# Patient Record
Sex: Male | Born: 1960 | Race: Black or African American | Hispanic: No | Marital: Married | Smoking: Never smoker
Health system: Southern US, Community
[De-identification: ages and names within clinical notes are randomized; demographics above are authoritative.]

## PROBLEM LIST (undated history)

## (undated) DIAGNOSIS — E119 Type 2 diabetes mellitus without complications: Secondary | ICD-10-CM

## (undated) HISTORY — PX: VASECTOMY: SHX75

---

## 2017-04-26 ENCOUNTER — Emergency Department (HOSPITAL_BASED_OUTPATIENT_CLINIC_OR_DEPARTMENT_OTHER): Payer: BLUE CROSS/BLUE SHIELD

## 2017-04-26 ENCOUNTER — Emergency Department (HOSPITAL_BASED_OUTPATIENT_CLINIC_OR_DEPARTMENT_OTHER)
Admission: EM | Admit: 2017-04-26 | Discharge: 2017-04-26 | Disposition: A | Discharge status: Home / Self Care | Payer: BLUE CROSS/BLUE SHIELD | Source: Home / Self Care | Attending: Emergency Medicine | Admitting: Emergency Medicine

## 2017-04-26 ENCOUNTER — Encounter (HOSPITAL_BASED_OUTPATIENT_CLINIC_OR_DEPARTMENT_OTHER): Payer: Self-pay | Admitting: *Deleted

## 2017-04-26 ENCOUNTER — Other Ambulatory Visit: Payer: Self-pay

## 2017-04-26 DIAGNOSIS — R1084 Generalized abdominal pain: Secondary | ICD-10-CM

## 2017-04-26 DIAGNOSIS — K5900 Constipation, unspecified: Secondary | ICD-10-CM | POA: Insufficient documentation

## 2017-04-26 DIAGNOSIS — R110 Nausea: Secondary | ICD-10-CM | POA: Insufficient documentation

## 2017-04-26 DIAGNOSIS — R339 Retention of urine, unspecified: Secondary | ICD-10-CM | POA: Insufficient documentation

## 2017-04-26 DIAGNOSIS — E119 Type 2 diabetes mellitus without complications: Secondary | ICD-10-CM | POA: Insufficient documentation

## 2017-04-26 DIAGNOSIS — C187 Malignant neoplasm of sigmoid colon: Secondary | ICD-10-CM | POA: Diagnosis not present

## 2017-04-26 DIAGNOSIS — K56699 Other intestinal obstruction unspecified as to partial versus complete obstruction: Secondary | ICD-10-CM | POA: Diagnosis not present

## 2017-04-26 HISTORY — DX: Type 2 diabetes mellitus without complications: E11.9

## 2017-04-26 LAB — COMPREHENSIVE METABOLIC PANEL
ALK PHOS: 102 U/L (ref 38–126)
ALT: 17 U/L (ref 17–63)
AST: 20 U/L (ref 15–41)
Albumin: 4.1 g/dL (ref 3.5–5.0)
Anion gap: 11 (ref 5–15)
BUN: 14 mg/dL (ref 6–20)
CALCIUM: 9.6 mg/dL (ref 8.9–10.3)
CO2: 23 mmol/L (ref 22–32)
CREATININE: 1.23 mg/dL (ref 0.61–1.24)
Chloride: 99 mmol/L — ABNORMAL LOW (ref 101–111)
GFR calc non Af Amer: >60 mL/min (ref >60)
Glucose, Bld: 125 mg/dL — ABNORMAL HIGH (ref 65–99)
Potassium: 4.2 mmol/L (ref 3.5–5.1)
Sodium: 133 mmol/L — ABNORMAL LOW (ref 135–145)
Total Bilirubin: 0.6 mg/dL (ref 0.3–1.2)
Total Protein: 8.6 g/dL — ABNORMAL HIGH (ref 6.5–8.1)

## 2017-04-26 LAB — CBC
HCT: 40.1 % (ref 39.0–52.0)
Hemoglobin: 13.6 g/dL (ref 13.0–17.0)
MCH: 31.2 pg (ref 26.0–34.0)
MCHC: 33.9 g/dL (ref 30.0–36.0)
MCV: 92 fL (ref 78.0–100.0)
PLATELETS: 424 10*3/uL — AB (ref 150–400)
RBC: 4.36 MIL/uL (ref 4.22–5.81)
RDW: 13.3 % (ref 11.5–15.5)
WBC: 12.4 10*3/uL — ABNORMAL HIGH (ref 4.0–10.5)

## 2017-04-26 LAB — CBG MONITORING, ED: Glucose-Capillary: 93 mg/dL (ref 65–99)

## 2017-04-26 LAB — URINALYSIS, ROUTINE W REFLEX MICROSCOPIC
BILIRUBIN URINE: NEGATIVE
Glucose, UA: NEGATIVE mg/dL
KETONES UR: 15 mg/dL — AB
Leukocytes, UA: NEGATIVE
Nitrite: NEGATIVE
PROTEIN: NEGATIVE mg/dL
Specific Gravity, Urine: 1.01 (ref 1.005–1.030)
pH: 6 (ref 5.0–8.0)

## 2017-04-26 LAB — LIPASE, BLOOD: Lipase: 23 U/L (ref 11–51)

## 2017-04-26 LAB — URINALYSIS, MICROSCOPIC (REFLEX)

## 2017-04-26 MED ORDER — MORPHINE SULFATE (PF) 4 MG/ML IV SOLN
4.0000 mg | Freq: Once | INTRAVENOUS | Status: AC
  Administered 2017-04-26: 4 mg via INTRAVENOUS
  Filled 2017-04-26: qty 1

## 2017-04-26 MED ORDER — METRONIDAZOLE 500 MG PO TABS
500.0000 mg | ORAL_TABLET | Freq: Once | ORAL | Status: AC
  Administered 2017-04-26: 500 mg via ORAL
  Filled 2017-04-26: qty 1

## 2017-04-26 MED ORDER — DICYCLOMINE HCL 10 MG/ML IM SOLN
20.0000 mg | Freq: Once | INTRAMUSCULAR | Status: AC
  Administered 2017-04-26: 20 mg via INTRAMUSCULAR
  Filled 2017-04-26: qty 2

## 2017-04-26 MED ORDER — CIPROFLOXACIN HCL 500 MG PO TABS
500.0000 mg | ORAL_TABLET | Freq: Once | ORAL | Status: AC
  Administered 2017-04-26: 500 mg via ORAL
  Filled 2017-04-26: qty 1

## 2017-04-26 MED ORDER — FAMOTIDINE IN NACL 20-0.9 MG/50ML-% IV SOLN
20.0000 mg | Freq: Once | INTRAVENOUS | Status: AC
  Administered 2017-04-26: 20 mg via INTRAVENOUS
  Filled 2017-04-26: qty 50

## 2017-04-26 MED ORDER — SODIUM CHLORIDE 0.9 % IV BOLUS (SEPSIS)
1000.0000 mL | Freq: Once | INTRAVENOUS | Status: AC
  Administered 2017-04-26: 1000 mL via INTRAVENOUS

## 2017-04-26 MED ORDER — ONDANSETRON 4 MG PO TBDP: 4.0000 mg | ORAL_TABLET | Freq: Three times a day (TID) | ORAL | 0 refills | Status: DC | PRN

## 2017-04-26 MED ORDER — IOPAMIDOL (ISOVUE-300) INJECTION 61%
100.0000 mL | Freq: Once | INTRAVENOUS | Status: AC | PRN
  Administered 2017-04-26: 100 mL via INTRAVENOUS

## 2017-04-26 MED ORDER — ONDANSETRON HCL 4 MG/2ML IJ SOLN
4.0000 mg | Freq: Once | INTRAMUSCULAR | Status: AC
  Administered 2017-04-26: 4 mg via INTRAVENOUS
  Filled 2017-04-26: qty 2

## 2017-04-26 MED ORDER — LIDOCAINE HCL 2 % EX GEL
1.0000 | Freq: Once | CUTANEOUS | Status: AC
Start: 2017-04-26 — End: 2017-04-26
  Administered 2017-04-26: 1 via URETHRAL
  Filled 2017-04-26: qty 20

## 2017-04-26 MED ORDER — METRONIDAZOLE 500 MG PO TABS: 500.0000 mg | ORAL_TABLET | Freq: Three times a day (TID) | ORAL | 0 refills | Status: DC

## 2017-04-26 MED ORDER — DICYCLOMINE HCL 20 MG PO TABS: 20.0000 mg | ORAL_TABLET | Freq: Two times a day (BID) | ORAL | 0 refills | Status: DC

## 2017-04-26 MED ORDER — GI COCKTAIL ~~LOC~~
30.0000 mL | Freq: Once | ORAL | Status: AC
  Administered 2017-04-26: 30 mL via ORAL
  Filled 2017-04-26: qty 30

## 2017-04-26 MED ORDER — CIPROFLOXACIN HCL 500 MG PO TABS: 500.0000 mg | ORAL_TABLET | Freq: Two times a day (BID) | ORAL | 0 refills | Status: DC

## 2017-04-26 MED ORDER — LIDOCAINE HCL (CARDIAC) 20 MG/ML IV SOLN
INTRAVENOUS | Status: AC
  Filled 2017-04-26: qty 5

## 2017-04-26 NOTE — ED Notes (Signed)
Pt given water for PO challenge 

## 2017-04-26 NOTE — ED Notes (Addendum)
Pt not able to get urine sample right now

## 2017-04-26 NOTE — ED Provider Notes (Signed)
[facility] EMERGENCY DEPARTMENT Provider Note   CSN: [id number] Arrival date & time: 04/26/17  1317     History   Chief Complaint Chief Complaint  Patient presents with  . Abdominal Pain    HPI Harry Avila is a 57 y.o. male.  HPI 57 year old African-American male with no pertinent past medical history presents to the emergency department today with complaints of generalized abdominal pain.  The patient states that his symptoms started approximately 2-3 days ago.  Patient states he was seen in urgent care for same and diagnosed with a viral illness.  Patient states that his symptoms are not improving.  He reports sharp cramping mid abdominal pain that is constant.  Nothing makes better or worse.  It is not associated with food.  The patient also reports loose stools over the past 1-2 months with no bowel movement in the past 2-3 days.  He states that he has not had any passing of gas over the past day and a half.  Patient also reports nausea but denies any emesis.  States that he recently returned from [place] 1 week ago.  Denies any known sick contacts.  Nothing makes his symptoms better or worse.  He is not taking anything for symptoms prior to arrival.  Pt denies any fever, chill, ha, vision changes, lightheadedness, dizziness, congestion, neck pain, cp, sob, cough, urinary symptoms, melena, hematochezia, lower extremity paresthesias.   Past Medical History:  Diagnosis Date  . Diabetes mellitus without complication ([place])     There are no active problems to display for this patient.   History reviewed. No pertinent surgical history.     Home Medications    Prior to Admission medications   Not on File    Family History No family history on file.  Social History Social History   Tobacco Use  . Smoking status: Never Smoker  . Smokeless tobacco: Never Used  Substance Use Topics  . Alcohol use: No    Frequency: Never  . Drug use: No      Allergies   Patient has no known allergies.   Review of Systems Review of Systems  Constitutional: Negative for chills and fever.  HENT: Negative for congestion and sore throat.   Eyes: Negative for visual disturbance.  Respiratory: Negative for cough and shortness of breath.   Cardiovascular: Negative for chest pain.  Gastrointestinal: Positive for abdominal pain, constipation and nausea. Negative for diarrhea and vomiting.  Genitourinary: Negative for dysuria, flank pain, frequency, hematuria, scrotal swelling, testicular pain and urgency.  Musculoskeletal: Negative for arthralgias and myalgias.  Skin: Negative for rash.  Neurological: Negative for dizziness, syncope, weakness, light-headedness, numbness and headaches.  Psychiatric/Behavioral: Negative for sleep disturbance. The patient is not nervous/anxious.      Physical Exam Updated Vital Signs BP (!) 144/104 (BP Location: Right Arm)   Pulse 92   Temp 98.4 F (36.9 C) (Oral)   Resp 16   Ht 5\' 10"  (1.778 m)   Wt 79.4 kg (175 lb)   SpO2 100%   BMI 25.11 kg/m   Physical Exam  Constitutional: He is oriented to person, place, and time. He appears well-developed and well-nourished.  Non-toxic appearance. He appears distressed (uncomfortable due to the pain).  HENT:  Head: Normocephalic and atraumatic.  Mouth/Throat: Oropharynx is clear and moist.  Eyes: Conjunctivae are normal. Pupils are equal, round, and reactive to light. Right eye exhibits no discharge. Left eye exhibits no discharge.  Neck: Normal range of motion.  Neck supple.  Cardiovascular: Normal rate, regular rhythm, normal heart sounds and intact distal pulses. Exam reveals no gallop and no friction rub.  No murmur heard. Pulmonary/Chest: Effort normal and breath sounds normal. No stridor. No respiratory distress. He has no wheezes. He has no rhonchi. He has no rales. He exhibits no tenderness.  Abdominal: Soft. He exhibits no distension and no pulsatile  midline mass. Bowel sounds are increased. There is generalized tenderness and tenderness in the epigastric area and periumbilical area. There is guarding. There is no rigidity, no rebound, no CVA tenderness, no tenderness at McBurney's point and negative Murphy's sign.  Musculoskeletal: Normal range of motion. He exhibits no tenderness.  Lymphadenopathy:    He has no cervical adenopathy.  Neurological: He is alert and oriented to person, place, and time.  Skin: Skin is warm and dry. Capillary refill takes less than 2 seconds. No rash noted.  Psychiatric: His behavior is normal. Judgment and thought content normal.  Nursing note and vitals reviewed.    ED Treatments / Results  Labs (all labs ordered are listed, but only abnormal results are displayed) Labs Reviewed  COMPREHENSIVE METABOLIC PANEL - Abnormal; Notable for the following components:      Result Value   Sodium 133 (*)    Chloride 99 (*)    Glucose, Bld 125 (*)    Total Protein 8.6 (*)    All other components within normal limits  CBC - Abnormal; Notable for the following components:   WBC 12.4 (*)    Platelets 424 (*)    All other components within normal limits  LIPASE, BLOOD  URINALYSIS, ROUTINE W REFLEX MICROSCOPIC    EKG  EKG Interpretation None       Radiology Ct Abdomen Pelvis W Contrast  Addendum Date: 04/26/2017   ADDENDUM REPORT: 04/26/2017 17:15 ADDENDUM: After review of the images, there is possible focal narrowing at the sigmoid colon, series 2, image number 45 with colon upstream to this filled with gas and liquid stools. Findings could be secondary to spasm within the colon or possible small focal area of colitis. Follow-up examination with oral contrast could be helpful if there is high suspicion for obstructive process. Electronically Signed   By: Donavan Foil M.D.   On: 04/26/2017 17:15   Result Date: 04/26/2017 CLINICAL DATA:  Pain across the entire abdomen at the umbilicus with nausea and  vomiting EXAM: CT ABDOMEN AND PELVIS WITH CONTRAST TECHNIQUE: Multidetector CT imaging of the abdomen and pelvis was performed using the standard protocol following bolus administration of intravenous contrast. CONTRAST:  121mL ISOVUE-300 IOPAMIDOL (ISOVUE-300) INJECTION 61% COMPARISON:  None. FINDINGS: Lower chest: Lung bases demonstrate no acute consolidation or pleural effusion. Normal heart size. Hepatobiliary: Subcentimeter hypodensity anterior aspect of the liver too small to further characterize. 11 mm intermediate density lesion posterior right hepatic lobe. No calcified gallstones or biliary dilatation Pancreas: Unremarkable. No pancreatic ductal dilatation or surrounding inflammatory changes. Spleen: Normal in size without focal abnormality. Adrenals/Urinary Tract: Adrenal glands are within normal limits. No hydronephrosis. 17 mm intermediate density lesion mid pole right kidney. Distended urinary bladder Stomach/Bowel: Stomach nonenlarged. Diffuse fluid-filled small and large bowel. Negative appendix. No colon wall thickening. Vascular/Lymphatic: No significant vascular findings are present. No enlarged abdominal or pelvic lymph nodes. Reproductive: Prostate is unremarkable. Other: Negative for free air. Tiny amount of fluid in the right lower quadrant. Musculoskeletal: Coarse dystrophic appearing calcification anterior to the left pubic bone, could relate to prior trauma or hematoma. No acute  or suspicious lesion IMPRESSION: 1. Negative for bowel obstruction, appendicitis, or bowel wall thickening. Scattered fluid-filled loops of small bowel with mild gaseous and fluid enlargement of the colon, query enteritis 2. Tiny amount of free fluid in the right lower quadrant 3. Indeterminate hypodense lesions in the right hepatic lobe and right kidney. When the patient is clinically stable and able to follow directions and hold their breath (preferably as an outpatient) further evaluation with dedicated abdominal  MRI should be considered. Electronically Signed: By: Donavan Foil M.D. On: 04/26/2017 15:50    Procedures Procedures (including critical care time)  Medications Ordered in ED Medications  lidocaine (cardiac) 100 mg/48ml (XYLOCAINE) 20 MG/ML injection 2% (  Not Given 04/26/17 1731)  morphine 4 MG/ML injection 4 mg (4 mg Intravenous Given 04/26/17 1455)  sodium chloride 0.9 % bolus 1,000 mL (0 mLs Intravenous Stopped 04/26/17 1555)  ondansetron (ZOFRAN) injection 4 mg (4 mg Intravenous Given 04/26/17 1455)  iopamidol (ISOVUE-300) 61 % injection 100 mL (100 mLs Intravenous Contrast Given 04/26/17 1525)  famotidine (PEPCID) IVPB 20 mg premix (0 mg Intravenous Stopped 04/26/17 1702)  gi cocktail (Maalox,Lidocaine,Donnatal) (30 mLs Oral Given 04/26/17 1614)  morphine 4 MG/ML injection 4 mg (4 mg Intravenous Given 04/26/17 1711)  dicyclomine (BENTYL) injection 20 mg (20 mg Intramuscular Given 04/26/17 1711)  lidocaine (XYLOCAINE) 2 % jelly 1 application (1 application Urethral Given 04/26/17 1730)     Initial Impression / Assessment and Plan / ED Course  I have reviewed the triage vital signs and the nursing notes.  Pertinent labs & imaging results that were available during my care of the patient were reviewed by me and considered in my medical decision making (see chart for details).     Patient presents to the emergency department today for evaluation of abdominal pain, decreased bowel movements, nausea.  Patient denies any associated fevers.  He did return recently from a stay over in [place].  Patient states that his bowels have been very loose for the past month with intermittent mucus and blood.  Denies any recent bloody stools.  States that he has not had a bowel movement in 4-5 days.  Patient also states that he has not passed gas for the past day.  Reports nausea but denies any emesis.  Denies any associated urinary symptoms.  Patient does appear uncomfortable due to pain.  Patient's  vital signs are reassuring.  Patient is afebrile.  No tachycardia noted.  Patient is hypertensive without history of same.  Lab work reveals a mild leukocytosis of 12,000.  The patient's electrolytes appear at baseline.  Patient's kidney function is normal.  Liver enzymes are unremarkable.  Lipase is normal.  UA does not show any signs of infection.  CT imaging was obtained;  MPRESSION: 1. Negative for bowel obstruction, appendicitis, or bowel wall thickening. Scattered fluid-filled loops of small bowel with mild gaseous and fluid enlargement of the colon, query enteritis 2. Tiny amount of free fluid in the right lower quadrant 3. Indeterminate hypodense lesions in the right hepatic lobe and right kidney. When the patient is clinically stable and able to follow directions and hold their breath (preferably as an outpatient) further evaluation with dedicated abdominal MRI should be considered.  After speaking with radiologist that she did have an addendum to the report but no signs of acute or high-grade obstruction.  ADDENDUM REPORT: 04/26/2017 17:15 ADDENDUM: After review of the images, there is possible focal narrowing at the sigmoid colon, series 2, image number  45 with colon upstream to this filled with gas and liquid stools. Findings could be secondary to spasm within the colon or possible small focal area of colitis. Follow-up examination with oral contrast could be helpful if there is high suspicion for obstructive process. Electronically Signed   By: Donavan Foil M.D.   On: 04/26/2017 17:15   Patient bladder is noted to be significantly distended.  Patient able to give [place] a urine sample in the ED.  Bladder scan reveals over thousand mL's of urine in the bladder.  Patient has acute urinary retention.  No history of same.  Patient has no lower extremity weakness on exam.  Sensation is intact.  No recent injury though be concerning for cauda equina.  Patient will have Foley catheter placed with  leg bag.  I will treat patient with Cipro and Flagyl for enteritis versus colitis.  Also give symptomatic treatment at home.  Encourage clear liquid diet for 24 hours and advance diet as tolerated.  Patient able to tolerate p.o. fluids in the ED.  Patient does not have any reason for admission at this time.  Will need urology follow-up.  After Foley was placed patient states that he feels less pressure in his abdomen.  Patient had 1000 cc of urine that was returned.  Repeat abdominal exam is benign at this time.  Patient appears more comfortable in the room.  Again we will treat patient with Cipro and Flagyl.  Have given urology follow-up.  Instructed patient to return in 2 days to the ED if symptoms not improving.  Have given him very strict return precautions for signs of obstruction or worsening symptoms.  Patient tolerating p.o. fluids.  Pt is hemodynamically stable, in NAD, & able to ambulate in the ED. Evaluation does not show pathology that would require ongoing emergent intervention or inpatient treatment. I explained the diagnosis to the patient. Pain has been managed & has no complaints prior to dc. Pt is comfortable with above plan and is stable for discharge at this time. All questions were answered prior to disposition. Strict return precautions for f/u to the ED were discussed. Encouraged follow up with PCP.  Pt dicussed with Dr. Leonette Monarch who is agreeable with the above plan.    Final Clinical Impressions(s) / ED Diagnoses   Final diagnoses:  Urinary retention  Generalized abdominal pain    ED Discharge Orders    None       Aaron Edelman 04/26/17 1813    Duffy Bruce, MD 04/27/17 [phone]

## 2017-04-26 NOTE — ED Triage Notes (Signed)
Abdominal pain across his mid abdomen and nausea for 3 days. He was seen at UC earlier in the week for same and told he has a virus.

## 2017-04-26 NOTE — Discharge Instructions (Signed)
I am treating you for infection of your intestines with antibiotics.  Take these medications as prescribed and complete the entire course.  Have given you Bentyl for any muscle or intestinal spasms.  For nausea take Zofran.  You do need to follow-up with urology.  Please call tomorrow to schedule an appointment this week given your urinary retention and your Foley in place.  Also take Motrin and Tylenol for pain.  If you have not had a bowel movement in 2 days return to the ED for further evaluation.  If you get worse in 2 days including fevers, bloody stools, worsening pain or vomiting return to the ED immediately.

## 2017-04-27 ENCOUNTER — Other Ambulatory Visit: Payer: Self-pay

## 2017-04-27 ENCOUNTER — Emergency Department (HOSPITAL_BASED_OUTPATIENT_CLINIC_OR_DEPARTMENT_OTHER): Payer: BLUE CROSS/BLUE SHIELD

## 2017-04-27 ENCOUNTER — Encounter (HOSPITAL_BASED_OUTPATIENT_CLINIC_OR_DEPARTMENT_OTHER): Payer: Self-pay | Admitting: Emergency Medicine

## 2017-04-27 ENCOUNTER — Inpatient Hospital Stay (HOSPITAL_BASED_OUTPATIENT_CLINIC_OR_DEPARTMENT_OTHER)
Admission: EM | Admit: 2017-04-27 | Discharge: 2017-05-05 | DRG: 330 | Disposition: A | Discharge status: Home / Self Care | Payer: BLUE CROSS/BLUE SHIELD | Attending: Internal Medicine | Admitting: Internal Medicine

## 2017-04-27 DIAGNOSIS — N2889 Other specified disorders of kidney and ureter: Secondary | ICD-10-CM | POA: Diagnosis present

## 2017-04-27 DIAGNOSIS — K56600 Partial intestinal obstruction, unspecified as to cause: Secondary | ICD-10-CM

## 2017-04-27 DIAGNOSIS — R160 Hepatomegaly, not elsewhere classified: Secondary | ICD-10-CM | POA: Diagnosis present

## 2017-04-27 DIAGNOSIS — Z933 Colostomy status: Secondary | ICD-10-CM

## 2017-04-27 DIAGNOSIS — R933 Abnormal findings on diagnostic imaging of other parts of digestive tract: Secondary | ICD-10-CM

## 2017-04-27 DIAGNOSIS — K56609 Unspecified intestinal obstruction, unspecified as to partial versus complete obstruction: Secondary | ICD-10-CM | POA: Diagnosis not present

## 2017-04-27 DIAGNOSIS — Z79899 Other long term (current) drug therapy: Secondary | ICD-10-CM | POA: Diagnosis not present

## 2017-04-27 DIAGNOSIS — K639 Disease of intestine, unspecified: Secondary | ICD-10-CM | POA: Diagnosis not present

## 2017-04-27 DIAGNOSIS — K56699 Other intestinal obstruction unspecified as to partial versus complete obstruction: Secondary | ICD-10-CM | POA: Diagnosis present

## 2017-04-27 DIAGNOSIS — F419 Anxiety disorder, unspecified: Secondary | ICD-10-CM | POA: Diagnosis present

## 2017-04-27 DIAGNOSIS — N179 Acute kidney failure, unspecified: Secondary | ICD-10-CM | POA: Diagnosis present

## 2017-04-27 DIAGNOSIS — N289 Disorder of kidney and ureter, unspecified: Secondary | ICD-10-CM

## 2017-04-27 DIAGNOSIS — C187 Malignant neoplasm of sigmoid colon: Secondary | ICD-10-CM | POA: Diagnosis present

## 2017-04-27 DIAGNOSIS — K6389 Other specified diseases of intestine: Secondary | ICD-10-CM

## 2017-04-27 DIAGNOSIS — E871 Hypo-osmolality and hyponatremia: Secondary | ICD-10-CM

## 2017-04-27 DIAGNOSIS — E119 Type 2 diabetes mellitus without complications: Secondary | ICD-10-CM | POA: Diagnosis present

## 2017-04-27 DIAGNOSIS — I10 Essential (primary) hypertension: Secondary | ICD-10-CM | POA: Diagnosis present

## 2017-04-27 LAB — CBC WITH DIFFERENTIAL/PLATELET
BASOS ABS: 0 10*3/uL (ref 0.0–0.1)
Basophils Relative: 0 %
Eosinophils Absolute: 0 10*3/uL (ref 0.0–0.7)
Eosinophils Relative: 0 %
HEMATOCRIT: 40.6 % (ref 39.0–52.0)
Hemoglobin: 13.5 g/dL (ref 13.0–17.0)
Lymphocytes Relative: 13 %
Lymphs Abs: 1.2 10*3/uL (ref 0.7–4.0)
MCH: 30.6 pg (ref 26.0–34.0)
MCHC: 33.3 g/dL (ref 30.0–36.0)
MCV: 92.1 fL (ref 78.0–100.0)
MONO ABS: 0.9 10*3/uL (ref 0.1–1.0)
Monocytes Relative: 11 %
NEUTROS ABS: 6.7 10*3/uL (ref 1.7–7.7)
Neutrophils Relative %: 76 %
PLATELETS: 427 10*3/uL — AB (ref 150–400)
RBC: 4.41 MIL/uL (ref 4.22–5.81)
RDW: 13.5 % (ref 11.5–15.5)
WBC: 8.9 10*3/uL (ref 4.0–10.5)

## 2017-04-27 LAB — COMPREHENSIVE METABOLIC PANEL
ALBUMIN: 3.7 g/dL (ref 3.5–5.0)
ALK PHOS: 93 U/L (ref 38–126)
ALT: 17 U/L (ref 17–63)
ANION GAP: 5 (ref 5–15)
AST: 19 U/L (ref 15–41)
BILIRUBIN TOTAL: 0.7 mg/dL (ref 0.3–1.2)
BUN: 20 mg/dL (ref 6–20)
CALCIUM: 9.3 mg/dL (ref 8.9–10.3)
CO2: 27 mmol/L (ref 22–32)
Chloride: 100 mmol/L — ABNORMAL LOW (ref 101–111)
Creatinine, Ser: 1.49 mg/dL — ABNORMAL HIGH (ref 0.61–1.24)
GFR, EST AFRICAN AMERICAN: 60 mL/min — AB (ref >60)
GFR, EST NON AFRICAN AMERICAN: 52 mL/min — AB (ref >60)
GLUCOSE: 129 mg/dL — AB (ref 65–99)
Potassium: 5 mmol/L (ref 3.5–5.1)
Sodium: 132 mmol/L — ABNORMAL LOW (ref 135–145)
TOTAL PROTEIN: 8 g/dL (ref 6.5–8.1)

## 2017-04-27 LAB — I-STAT CG4 LACTIC ACID, ED
LACTIC ACID, VENOUS: 1.71 mmol/L (ref 0.5–1.9)
Lactic Acid, Venous: 1.56 mmol/L (ref 0.5–1.9)

## 2017-04-27 LAB — GLUCOSE, CAPILLARY
GLUCOSE-CAPILLARY: 97 mg/dL (ref 65–99)
Glucose-Capillary: 76 mg/dL (ref 65–99)

## 2017-04-27 LAB — CBG MONITORING, ED: GLUCOSE-CAPILLARY: 79 mg/dL (ref 65–99)

## 2017-04-27 MED ORDER — MORPHINE SULFATE (PF) 4 MG/ML IV SOLN
4.0000 mg | INTRAVENOUS | Status: AC | PRN
  Administered 2017-04-27 – 2017-04-28 (×3): 4 mg via INTRAVENOUS
  Filled 2017-04-27 (×3): qty 1

## 2017-04-27 MED ORDER — SODIUM CHLORIDE 0.9 % IV SOLN
INTRAVENOUS | Status: AC
  Administered 2017-04-27 – 2017-04-28 (×2): via INTRAVENOUS

## 2017-04-27 MED ORDER — MORPHINE SULFATE (PF) 4 MG/ML IV SOLN
4.0000 mg | Freq: Once | INTRAVENOUS | Status: AC
  Administered 2017-04-27: 4 mg via INTRAVENOUS
  Filled 2017-04-27: qty 1

## 2017-04-27 MED ORDER — ONDANSETRON HCL 4 MG/2ML IJ SOLN
4.0000 mg | Freq: Once | INTRAMUSCULAR | Status: AC
Start: 2017-04-27 — End: 2017-04-27
  Administered 2017-04-27: 4 mg via INTRAVENOUS
  Filled 2017-04-27: qty 2

## 2017-04-27 MED ORDER — ONDANSETRON HCL 4 MG/2ML IJ SOLN
4.0000 mg | Freq: Once | INTRAMUSCULAR | Status: AC
  Administered 2017-04-27: 4 mg via INTRAVENOUS
  Filled 2017-04-27: qty 2

## 2017-04-27 MED ORDER — SODIUM CHLORIDE 0.9 % IV BOLUS (SEPSIS)
1000.0000 mL | Freq: Once | INTRAVENOUS | Status: AC
  Administered 2017-04-27: 1000 mL via INTRAVENOUS

## 2017-04-27 MED ORDER — INSULIN ASPART 100 UNIT/ML ~~LOC~~ SOLN: 0.0000 [IU] | SUBCUTANEOUS | Status: DC

## 2017-04-27 MED ORDER — ONDANSETRON HCL 4 MG/2ML IJ SOLN
4.0000 mg | Freq: Four times a day (QID) | INTRAMUSCULAR | Status: DC | PRN
  Administered 2017-04-28: 4 mg via INTRAVENOUS
  Filled 2017-04-27: qty 2

## 2017-04-27 MED ORDER — ENOXAPARIN SODIUM 40 MG/0.4ML ~~LOC~~ SOLN
40.0000 mg | SUBCUTANEOUS | Status: DC
  Administered 2017-04-27: 40 mg via SUBCUTANEOUS
  Filled 2017-04-27: qty 0.4

## 2017-04-27 MED ORDER — ACETAMINOPHEN 650 MG RE SUPP: 650.0000 mg | Freq: Four times a day (QID) | RECTAL | Status: DC | PRN

## 2017-04-27 MED ORDER — KCL IN DEXTROSE-NACL 10-5-0.45 MEQ/L-%-% IV SOLN
INTRAVENOUS | Status: DC
  Filled 2017-04-27: qty 1000

## 2017-04-27 MED ORDER — ACETAMINOPHEN 325 MG PO TABS: 650.0000 mg | ORAL_TABLET | Freq: Four times a day (QID) | ORAL | Status: DC | PRN

## 2017-04-27 NOTE — ED Notes (Signed)
CT staff giving pt the oral contrast at this time.

## 2017-04-27 NOTE — H&P (View-Only) (Signed)
Referring Provider: Dr. Rodena Piety, [facility] Primary Care Physician:  System, Pcp Not In Primary Gastroenterologist:  None, unassigned  Reason for Consultation:  Abnormal CT scan, colonic obstruction  HPI: Harry Avila is a 57 y.o. male who presented to [facility] on 04/26/17, with 2-3 days of abdominal pain.  He was seen initially in urgent care and thought to have a viral illness.  He reports loose stools over 1-40-month.  With no bowel movements over the 2-3 days prior to admission.  Is also not passing gas.  He has had nausea but no emesis.  He recently returned from [place], [place] about 1 week prior to admission.  Workup yesterday showed a fairly normal CMP.  WBC was 12.4, hemoglobin 13.6 grams, platelets were 424,000.  Urinalysis was unremarkable.  A CT scan was obtained.  There was no bowel obstruction appendicitis or bowel wall thickening noted.  There are scattered fluid-filled loops of small bowel with mild gaseous and fluid enlargement of the colon.  A possible enteritis.  There is also a tiny amount of fluid in the right lower quadrant.  CT scan showed that his bladder was very distended and a bladder scan revealed over 1000 mL's of urine in his bladder.  He was thought to have acute urinary retention.  Foley was placed and patient felt better after 1000 cc of urine was returned.  He was started on Cipro and Flagyl and arrangements were made for follow-up with urology.  A further review of the original CT showed possible focal narrowing of the sigmoid colon possibly a colitis.  They recommended a CT scan with contrast and this was completed today.  Today's study showed a persistent narrowing of the proximal sigmoid colon suspicious for malignancy causing a high-grade partial obstruction in the proximal colon and small bowel with increased amounts of free fluid in the pelvis.  At this point the Foley catheter had decompressed the bladder well.  Additionally there is a indeterminate liver  and right renal lesion not well delineated on the current exam.  Patient is being transferred to medicine service at [facility] and GI has been consulted.  Patient tells me that he's had a change in bowel habits over the past 5-6 weeks with loose stools.  Feeling badly over the past 3-4 days.  No BM or flatus in 4 days.  Has NGT in place.  Has not had any nausea or vomiting.  No abdominal pain currently.   Past Medical History:  Diagnosis Date  . Diabetes mellitus without complication ([place])     History reviewed. No pertinent surgical history.  Prior to Admission medications   Medication Sig Start Date End Date Taking? Authorizing Provider  ciprofloxacin (CIPRO) 500 MG tablet Take 1 tablet (500 mg total) by mouth 2 (two) times daily. 04/26/17   Doristine Devoid, PA-C  dicyclomine (BENTYL) 20 MG tablet Take 1 tablet (20 mg total) by mouth 2 (two) times daily. 04/26/17   Doristine Devoid, PA-C  metroNIDAZOLE (FLAGYL) 500 MG tablet Take 1 tablet (500 mg total) by mouth 3 (three) times daily. DO NOT CONSUME ALCOHOL WHILE TAKING THIS MEDICATION. 04/26/17   Ocie Cornfield T, PA-C  ondansetron (ZOFRAN-ODT) 4 MG disintegrating tablet Take 1 tablet (4 mg total) by mouth every 8 (eight) hours as needed for nausea. 04/26/17   Doristine Devoid, PA-C    Current Facility-Administered Medications  Medication Dose Route Frequency Provider Last Rate Last Dose  . dextrose 5 % and  0.45 % NaCl with KCl 10 mEq/L infusion   Intravenous Continuous Short, Mackenzie, MD      . insulin aspart (novoLOG) injection 0-5 Units  0-5 Units Subcutaneous Q4H Short, Mackenzie, MD      . morphine 4 MG/ML injection 4 mg  4 mg Intravenous Q1H PRN Tanna Furry, MD   4 mg at 04/27/17 1624   Current Outpatient Medications  Medication Sig Dispense Refill  . ciprofloxacin (CIPRO) 500 MG tablet Take 1 tablet (500 mg total) by mouth 2 (two) times daily. 14 tablet 0  . dicyclomine (BENTYL) 20 MG tablet Take 1 tablet  (20 mg total) by mouth 2 (two) times daily. 20 tablet 0  . metroNIDAZOLE (FLAGYL) 500 MG tablet Take 1 tablet (500 mg total) by mouth 3 (three) times daily. DO NOT CONSUME ALCOHOL WHILE TAKING THIS MEDICATION. 21 tablet 0  . ondansetron (ZOFRAN-ODT) 4 MG disintegrating tablet Take 1 tablet (4 mg total) by mouth every 8 (eight) hours as needed for nausea. 6 tablet 0    Allergies as of 04/27/2017  . (No Known Allergies)    No family history on file.  Social History   Socioeconomic History  . Marital status: Married    Spouse name: Not on file  . Number of children: Not on file  . Years of education: Not on file  . Highest education level: Not on file  Social Needs  . Financial resource strain: Not on file  . Food insecurity - worry: Not on file  . Food insecurity - inability: Not on file  . Transportation needs - medical: Not on file  . Transportation needs - non-medical: Not on file  Occupational History  . Not on file  Tobacco Use  . Smoking status: Never Smoker  . Smokeless tobacco: Never Used  Substance and Sexual Activity  . Alcohol use: No    Frequency: Never  . Drug use: No  . Sexual activity: Not on file  Other Topics Concern  . Not on file  Social History Narrative  . Not on file    Review of Systems: ROS is O/W negative except as mentioned in HPI.  Physical Exam: Vital signs in last 24 hours: Temp:  [98.2 F (36.8 C)-98.3 F (36.8 C)] 98.2 F (36.8 C) (01/15 1434) Pulse Rate:  [60-74] 74 (01/15 1323) Resp:  [16-18] 16 (01/15 1323) BP: (128-161)/(98-100) 161/100 (01/15 1323) SpO2:  [98 %-100 %] 98 % (01/15 1323) Weight:  [175 lb (79.4 kg)] 175 lb (79.4 kg) (01/15 1055)   General:  Alert, Well-developed, well-nourished, pleasant and cooperative in NAD Head:  Normocephalic and atraumatic. Eyes:  Sclera clear, no icterus.  Conjunctiva pink. Ears:  Normal auditory acuity. Mouth:  No deformity or lesions.   Lungs:  Clear throughout to auscultation.  No  wheezes, crackles, or rhonchi.  Heart:  Regular rate and rhythm; no murmurs, clicks, rubs,  or gallops. Abdomen:  Soft, slightly distended.  BS present.  Non-tender. Rectal:  Deferred  Msk:  Symmetrical without gross deformities. Pulses:  Normal pulses noted. Extremities:  Without clubbing or edema. Neurologic:  Alert and  oriented x4;  grossly normal neurologically. Skin:  Intact without significant lesions or rashes. Psych:  Alert and cooperative. Normal mood and affect.  Intake/Output this shift: Total I/O In: 2000 [IV Piggyback:2000] Out: 300 [Urine:300]  Lab Results: Recent Labs    04/26/17 1337 04/27/17 1130  WBC 12.4* 8.9  HGB 13.6 13.5  HCT 40.1 40.6  PLT 424* 427*  BMET Recent Labs    04/26/17 1337 04/27/17 1130  NA 133* 132*  K 4.2 5.0  CL 99* 100*  CO2 23 27  GLUCOSE 125* 129*  BUN 14 20  CREATININE 1.23 1.49*  CALCIUM 9.6 9.3   LFT Recent Labs    04/27/17 1130  PROT 8.0  ALBUMIN 3.7  AST 19  ALT 17  ALKPHOS 93  BILITOT 0.7   Studies/Results: Ct Abdomen Pelvis Wo Contrast  Result Date: 04/27/2017 CLINICAL DATA:  57 year old male with suprapubic pain for 2 days. Subsequent encounter. EXAM: CT ABDOMEN AND PELVIS WITHOUT CONTRAST TECHNIQUE: Multidetector CT imaging of the abdomen and pelvis was performed following the standard protocol without IV contrast. COMPARISON:  04/26/2017 CT. FINDINGS: Lower chest: Basilar subsegmental atelectasis. Heart size within normal limits. Hepatobiliary: 11 mm posterior right lobe of indeterminate hypodense lesion. Vicarious excretion of contrast in the gallbladder. Pancreas: Taking into account limitation by non contrast imaging, no pancreatic mass or inflammation. Spleen: Taking into account limitation by non contrast imaging, no splenic mass or enlargement. Adrenals/Urinary Tract: Dense pyramids bilaterally suggestive of medullary sponge disease without obstructing stone or hydronephrosis. 1.7 cm indeterminate lesion  anterior aspect midportion right kidney better delineated on prior CT. Urinary bladder decompressed with Foley catheter. No adrenal mass. Stomach/Bowel: Persistent circumferential narrowing of the proximal sigmoid colon. This does not cause complete obstruction as gas has traversed beyond this region however, proximal large and small bowel dilated consistent with high-grade partial obstruction. Vascular/Lymphatic: No aortic aneurysm.  No adenopathy. Reproductive: Prostate gland unremarkable. Other: Increase amount of free fluid most prominent pelvic region. No free intraperitoneal air. No bowel containing hernia. Musculoskeletal: Coarse calcifications anterior to left pubic symphysis may be related prior trauma. IMPRESSION: Persistent circumferential narrowing of the proximal sigmoid colon suspicious for malignancy (as versus non malignant stricture/focal colitis). This is causing high-grade partial obstruction of proximal colon and small bowel. Increase in amount of free fluid within the pelvis. Foley catheter with decompression of urinary bladder. Medullary sponge kidneys. Previously noted indeterminate liver and right renal lesion not as well delineated on the present exam. Please see prior CT report recommendation. These results were called by telephone at the time of interpretation on 04/27/2017 at 3:05 pm to Dr. Theotis Burrow , who verbally acknowledged these results. Electronically Signed   By: Genia Del M.D.   On: 04/27/2017 15:08   Ct Abdomen Pelvis W Contrast  Addendum Date: 04/26/2017   ADDENDUM REPORT: 04/26/2017 17:15 ADDENDUM: After review of the images, there is possible focal narrowing at the sigmoid colon, series 2, image number 45 with colon upstream to this filled with gas and liquid stools. Findings could be secondary to spasm within the colon or possible small focal area of colitis. Follow-up examination with oral contrast could be helpful if there is high suspicion for obstructive  process. Electronically Signed   By: Donavan Foil M.D.   On: 04/26/2017 17:15   Result Date: 04/26/2017 CLINICAL DATA:  Pain across the entire abdomen at the umbilicus with nausea and vomiting EXAM: CT ABDOMEN AND PELVIS WITH CONTRAST TECHNIQUE: Multidetector CT imaging of the abdomen and pelvis was performed using the standard protocol following bolus administration of intravenous contrast. CONTRAST:  132mL ISOVUE-300 IOPAMIDOL (ISOVUE-300) INJECTION 61% COMPARISON:  None. FINDINGS: Lower chest: Lung bases demonstrate no acute consolidation or pleural effusion. Normal heart size. Hepatobiliary: Subcentimeter hypodensity anterior aspect of the liver too small to further characterize. 11 mm intermediate density lesion posterior right hepatic lobe. No calcified gallstones  or biliary dilatation Pancreas: Unremarkable. No pancreatic ductal dilatation or surrounding inflammatory changes. Spleen: Normal in size without focal abnormality. Adrenals/Urinary Tract: Adrenal glands are within normal limits. No hydronephrosis. 17 mm intermediate density lesion mid pole right kidney. Distended urinary bladder Stomach/Bowel: Stomach nonenlarged. Diffuse fluid-filled small and large bowel. Negative appendix. No colon wall thickening. Vascular/Lymphatic: No significant vascular findings are present. No enlarged abdominal or pelvic lymph nodes. Reproductive: Prostate is unremarkable. Other: Negative for free air. Tiny amount of fluid in the right lower quadrant. Musculoskeletal: Coarse dystrophic appearing calcification anterior to the left pubic bone, could relate to prior trauma or hematoma. No acute or suspicious lesion IMPRESSION: 1. Negative for bowel obstruction, appendicitis, or bowel wall thickening. Scattered fluid-filled loops of small bowel with mild gaseous and fluid enlargement of the colon, query enteritis 2. Tiny amount of free fluid in the right lower quadrant 3. Indeterminate hypodense lesions in the right hepatic  lobe and right kidney. When the patient is clinically stable and able to follow directions and hold their breath (preferably as an outpatient) further evaluation with dedicated abdominal MRI should be considered. Electronically Signed: By: Donavan Foil M.D. 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  Has not passed anything per rectum in 4 days, but CT scan suggests only partial obstruction.  PLAN: *We are going to give him two enemas to see if we can clean him our from below and then begin slow Miralax bowel prep with NGT clamped.  Will plan for at least flex sig, possible full colonoscopy for tomorrow, 1/17.   Laban Emperor. Zehr  04/27/2017, 4:42 PM  Pager number [phone]   Attending physician's note   I have taken a history, examined the patient and reviewed the chart. I agree with the Advanced Practitioner's note, impression and recommendations. Evidence of colonic stricture/ partial obstruction in the sigmoid colon.  He feels better after NG tube placement.  Abdomen is distended but soft, no tenderness.  Per patient he is not passing any flatus though some air was noted beyond the site of obstruction on CT Will start with enema X 2 and plan for flex sig this afternoon tentatively. If he does well with the enema and passing flatus, will consider doing a full prep and colonoscopy to evaluate . Unprepped procedure will likely not be successful given he has significant amount of stool burden based on CT  K Denzil Magnuson, MD [phone] Mon-Fri 8a-5p [phone] after 5p, weekends, holidays

## 2017-04-27 NOTE — ED Notes (Signed)
ED Provider at bedside. 

## 2017-04-27 NOTE — Consult Note (Signed)
Referring Provider: Dr. Rodena Piety, [facility] Primary Care Physician:  System, Pcp Not In Primary Gastroenterologist:  None, unassigned  Reason for Consultation:  Abnormal CT scan, colonic obstruction  HPI: Harry Avila is a 57 y.o. male who presented to [facility] on 04/26/17, with 2-3 days of abdominal pain.  He was seen initially in urgent care and thought to have a viral illness.  He reports loose stools over 1-15-month.  With no bowel movements over the 2-3 days prior to admission.  Is also not passing gas.  He has had nausea but no emesis.  He recently returned from [place], [place] about 1 week prior to admission.  Workup yesterday showed a fairly normal CMP.  WBC was 12.4, hemoglobin 13.6 grams, platelets were 424,000.  Urinalysis was unremarkable.  A CT scan was obtained.  There was no bowel obstruction appendicitis or bowel wall thickening noted.  There are scattered fluid-filled loops of small bowel with mild gaseous and fluid enlargement of the colon.  A possible enteritis.  There is also a tiny amount of fluid in the right lower quadrant.  CT scan showed that his bladder was very distended and a bladder scan revealed over 1000 mL's of urine in his bladder.  He was thought to have acute urinary retention.  Foley was placed and patient felt better after 1000 cc of urine was returned.  He was started on Cipro and Flagyl and arrangements were made for follow-up with urology.  A further review of the original CT showed possible focal narrowing of the sigmoid colon possibly a colitis.  They recommended a CT scan with contrast and this was completed today.  Today's study showed a persistent narrowing of the proximal sigmoid colon suspicious for malignancy causing a high-grade partial obstruction in the proximal colon and small bowel with increased amounts of free fluid in the pelvis.  At this point the Foley catheter had decompressed the bladder well.  Additionally there is a indeterminate liver  and right renal lesion not well delineated on the current exam.  Patient is being transferred to medicine service at [facility] and GI has been consulted.  Patient tells me that he's had a change in bowel habits over the past 5-6 weeks with loose stools.  Feeling badly over the past 3-4 days.  No BM or flatus in 4 days.  Has NGT in place.  Has not had any nausea or vomiting.  No abdominal pain currently.   Past Medical History:  Diagnosis Date  . Diabetes mellitus without complication ([place])     History reviewed. No pertinent surgical history.  Prior to Admission medications   Medication Sig Start Date End Date Taking? Authorizing Provider  ciprofloxacin (CIPRO) 500 MG tablet Take 1 tablet (500 mg total) by mouth 2 (two) times daily. 04/26/17   Doristine Devoid, PA-C  dicyclomine (BENTYL) 20 MG tablet Take 1 tablet (20 mg total) by mouth 2 (two) times daily. 04/26/17   Doristine Devoid, PA-C  metroNIDAZOLE (FLAGYL) 500 MG tablet Take 1 tablet (500 mg total) by mouth 3 (three) times daily. DO NOT CONSUME ALCOHOL WHILE TAKING THIS MEDICATION. 04/26/17   Ocie Cornfield T, PA-C  ondansetron (ZOFRAN-ODT) 4 MG disintegrating tablet Take 1 tablet (4 mg total) by mouth every 8 (eight) hours as needed for nausea. 04/26/17   Doristine Devoid, PA-C    Current Facility-Administered Medications  Medication Dose Route Frequency Provider Last Rate Last Dose  . dextrose 5 % and  0.45 % NaCl with KCl 10 mEq/L infusion   Intravenous Continuous Short, Mackenzie, MD      . insulin aspart (novoLOG) injection 0-5 Units  0-5 Units Subcutaneous Q4H Short, Mackenzie, MD      . morphine 4 MG/ML injection 4 mg  4 mg Intravenous Q1H PRN Tanna Furry, MD   4 mg at 04/27/17 1624   Current Outpatient Medications  Medication Sig Dispense Refill  . ciprofloxacin (CIPRO) 500 MG tablet Take 1 tablet (500 mg total) by mouth 2 (two) times daily. 14 tablet 0  . dicyclomine (BENTYL) 20 MG tablet Take 1 tablet  (20 mg total) by mouth 2 (two) times daily. 20 tablet 0  . metroNIDAZOLE (FLAGYL) 500 MG tablet Take 1 tablet (500 mg total) by mouth 3 (three) times daily. DO NOT CONSUME ALCOHOL WHILE TAKING THIS MEDICATION. 21 tablet 0  . ondansetron (ZOFRAN-ODT) 4 MG disintegrating tablet Take 1 tablet (4 mg total) by mouth every 8 (eight) hours as needed for nausea. 6 tablet 0    Allergies as of 04/27/2017  . (No Known Allergies)    No family history on file.  Social History   Socioeconomic History  . Marital status: Married    Spouse name: Not on file  . Number of children: Not on file  . Years of education: Not on file  . Highest education level: Not on file  Social Needs  . Financial resource strain: Not on file  . Food insecurity - worry: Not on file  . Food insecurity - inability: Not on file  . Transportation needs - medical: Not on file  . Transportation needs - non-medical: Not on file  Occupational History  . Not on file  Tobacco Use  . Smoking status: Never Smoker  . Smokeless tobacco: Never Used  Substance and Sexual Activity  . Alcohol use: No    Frequency: Never  . Drug use: No  . Sexual activity: Not on file  Other Topics Concern  . Not on file  Social History Narrative  . Not on file    Review of Systems: ROS is O/W negative except as mentioned in HPI.  Physical Exam: Vital signs in last 24 hours: Temp:  [98.2 F (36.8 C)-98.3 F (36.8 C)] 98.2 F (36.8 C) (01/15 1434) Pulse Rate:  [60-74] 74 (01/15 1323) Resp:  [16-18] 16 (01/15 1323) BP: (128-161)/(98-100) 161/100 (01/15 1323) SpO2:  [98 %-100 %] 98 % (01/15 1323) Weight:  [175 lb (79.4 kg)] 175 lb (79.4 kg) (01/15 1055)   General:  Alert, Well-developed, well-nourished, pleasant and cooperative in NAD Head:  Normocephalic and atraumatic. Eyes:  Sclera clear, no icterus.  Conjunctiva pink. Ears:  Normal auditory acuity. Mouth:  No deformity or lesions.   Lungs:  Clear throughout to auscultation.  No  wheezes, crackles, or rhonchi.  Heart:  Regular rate and rhythm; no murmurs, clicks, rubs,  or gallops. Abdomen:  Soft, slightly distended.  BS present.  Non-tender. Rectal:  Deferred  Msk:  Symmetrical without gross deformities. Pulses:  Normal pulses noted. Extremities:  Without clubbing or edema. Neurologic:  Alert and  oriented x4;  grossly normal neurologically. Skin:  Intact without significant lesions or rashes. Psych:  Alert and cooperative. Normal mood and affect.  Intake/Output this shift: Total I/O In: 2000 [IV Piggyback:2000] Out: 300 [Urine:300]  Lab Results: Recent Labs    04/26/17 1337 04/27/17 1130  WBC 12.4* 8.9  HGB 13.6 13.5  HCT 40.1 40.6  PLT 424* 427*  BMET Recent Labs    04/26/17 1337 04/27/17 1130  NA 133* 132*  K 4.2 5.0  CL 99* 100*  CO2 23 27  GLUCOSE 125* 129*  BUN 14 20  CREATININE 1.23 1.49*  CALCIUM 9.6 9.3   LFT Recent Labs    04/27/17 1130  PROT 8.0  ALBUMIN 3.7  AST 19  ALT 17  ALKPHOS 93  BILITOT 0.7   Studies/Results: Ct Abdomen Pelvis Wo Contrast  Result Date: 04/27/2017 CLINICAL DATA:  57 year old male with suprapubic pain for 2 days. Subsequent encounter. EXAM: CT ABDOMEN AND PELVIS WITHOUT CONTRAST TECHNIQUE: Multidetector CT imaging of the abdomen and pelvis was performed following the standard protocol without IV contrast. COMPARISON:  04/26/2017 CT. FINDINGS: Lower chest: Basilar subsegmental atelectasis. Heart size within normal limits. Hepatobiliary: 11 mm posterior right lobe of indeterminate hypodense lesion. Vicarious excretion of contrast in the gallbladder. Pancreas: Taking into account limitation by non contrast imaging, no pancreatic mass or inflammation. Spleen: Taking into account limitation by non contrast imaging, no splenic mass or enlargement. Adrenals/Urinary Tract: Dense pyramids bilaterally suggestive of medullary sponge disease without obstructing stone or hydronephrosis. 1.7 cm indeterminate lesion  anterior aspect midportion right kidney better delineated on prior CT. Urinary bladder decompressed with Foley catheter. No adrenal mass. Stomach/Bowel: Persistent circumferential narrowing of the proximal sigmoid colon. This does not cause complete obstruction as gas has traversed beyond this region however, proximal large and small bowel dilated consistent with high-grade partial obstruction. Vascular/Lymphatic: No aortic aneurysm.  No adenopathy. Reproductive: Prostate gland unremarkable. Other: Increase amount of free fluid most prominent pelvic region. No free intraperitoneal air. No bowel containing hernia. Musculoskeletal: Coarse calcifications anterior to left pubic symphysis may be related prior trauma. IMPRESSION: Persistent circumferential narrowing of the proximal sigmoid colon suspicious for malignancy (as versus non malignant stricture/focal colitis). This is causing high-grade partial obstruction of proximal colon and small bowel. Increase in amount of free fluid within the pelvis. Foley catheter with decompression of urinary bladder. Medullary sponge kidneys. Previously noted indeterminate liver and right renal lesion not as well delineated on the present exam. Please see prior CT report recommendation. These results were called by telephone at the time of interpretation on 04/27/2017 at 3:05 pm to Dr. Theotis Burrow , who verbally acknowledged these results. Electronically Signed   By: Genia Del M.D.   On: 04/27/2017 15:08   Ct Abdomen Pelvis W Contrast  Addendum Date: 04/26/2017   ADDENDUM REPORT: 04/26/2017 17:15 ADDENDUM: After review of the images, there is possible focal narrowing at the sigmoid colon, series 2, image number 45 with colon upstream to this filled with gas and liquid stools. Findings could be secondary to spasm within the colon or possible small focal area of colitis. Follow-up examination with oral contrast could be helpful if there is high suspicion for obstructive  process. Electronically Signed   By: Donavan Foil M.D.   On: 04/26/2017 17:15   Result Date: 04/26/2017 CLINICAL DATA:  Pain across the entire abdomen at the umbilicus with nausea and vomiting EXAM: CT ABDOMEN AND PELVIS WITH CONTRAST TECHNIQUE: Multidetector CT imaging of the abdomen and pelvis was performed using the standard protocol following bolus administration of intravenous contrast. CONTRAST:  163mL ISOVUE-300 IOPAMIDOL (ISOVUE-300) INJECTION 61% COMPARISON:  None. FINDINGS: Lower chest: Lung bases demonstrate no acute consolidation or pleural effusion. Normal heart size. Hepatobiliary: Subcentimeter hypodensity anterior aspect of the liver too small to further characterize. 11 mm intermediate density lesion posterior right hepatic lobe. No calcified gallstones  or biliary dilatation Pancreas: Unremarkable. No pancreatic ductal dilatation or surrounding inflammatory changes. Spleen: Normal in size without focal abnormality. Adrenals/Urinary Tract: Adrenal glands are within normal limits. No hydronephrosis. 17 mm intermediate density lesion mid pole right kidney. Distended urinary bladder Stomach/Bowel: Stomach nonenlarged. Diffuse fluid-filled small and large bowel. Negative appendix. No colon wall thickening. Vascular/Lymphatic: No significant vascular findings are present. No enlarged abdominal or pelvic lymph nodes. Reproductive: Prostate is unremarkable. Other: Negative for free air. Tiny amount of fluid in the right lower quadrant. Musculoskeletal: Coarse dystrophic appearing calcification anterior to the left pubic bone, could relate to prior trauma or hematoma. No acute or suspicious lesion IMPRESSION: 1. Negative for bowel obstruction, appendicitis, or bowel wall thickening. Scattered fluid-filled loops of small bowel with mild gaseous and fluid enlargement of the colon, query enteritis 2. Tiny amount of free fluid in the right lower quadrant 3. Indeterminate hypodense lesions in the right hepatic  lobe and right kidney. When the patient is clinically stable and able to follow directions and hold their breath (preferably as an outpatient) further evaluation with dedicated abdominal MRI should be considered. Electronically Signed: By: Donavan Foil M.D. 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  Has not passed anything per rectum in 4 days, but CT scan suggests only partial obstruction.  PLAN: *We are going to give him two enemas to see if we can clean him our from below and then begin slow Miralax bowel prep with NGT clamped.  Will plan for at least flex sig, possible full colonoscopy for tomorrow, 1/17.   Laban Emperor. Zehr  04/27/2017, 4:42 PM  Pager number [phone]   Attending physician's note   I have taken a history, examined the patient and reviewed the chart. I agree with the Advanced Practitioner's note, impression and recommendations. Evidence of colonic stricture/ partial obstruction in the sigmoid colon.  He feels better after NG tube placement.  Abdomen is distended but soft, no tenderness.  Per patient he is not passing any flatus though some air was noted beyond the site of obstruction on CT Will start with enema X 2 and plan for flex sig this afternoon tentatively. If he does well with the enema and passing flatus, will consider doing a full prep and colonoscopy to evaluate . Unprepped procedure will likely not be successful given he has significant amount of stool burden based on CT  K Denzil Magnuson, MD [phone] Mon-Fri 8a-5p [phone] after 5p, weekends, holidays

## 2017-04-27 NOTE — ED Provider Notes (Signed)
[facility] EMERGENCY DEPARTMENT Provider Note   CSN: [id number] Arrival date & time: 04/27/17  1037     History   Chief Complaint Chief Complaint  Patient presents with  . Abdominal Pain    HPI Harry Avila is a 57 y.o. male.  57 year old male who presents with abdominal pain and nausea. 3-4 days ago he began having generalized abd pain; he was seen in urgent care and diagnosed with a viral illness.  He was evaluated here yesterday where CT scan showed possible enteritis.  He was retaining urine and a Foley catheter was placed at that has been draining normally.  He was given Bentyl, Zofran, Cipro, and Flagyl which he has taken at home with no improvement.  He continues to have constant, generalized abdominal pain that is not worse with palpation.  Nothing makes better or worse.  It is not associated with food.  The patient also reports loose stools over the past 1-2 months with no bowel movement in the past 3-4 days.  He has not had any passing of gas over the past several days.  He has had nausea without vomiting. he recently returned from [place] 1 week ago.  He denies any associated chest pain, shortness of breath, fever, cough/cold symptoms, sick contacts.  He has never had a colonoscopy before.   The history is provided by the patient.  Abdominal Pain      Past Medical History:  Diagnosis Date  . Diabetes mellitus without complication [facility])     Patient Active Problem List   Diagnosis Date Noted  . 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 No pertinent surgical history.     Home Medications    Prior to Admission medications   Medication Sig Start Date End Date Taking? Authorizing Provider  ciprofloxacin (CIPRO) 500 MG tablet Take 1 tablet (500 mg total) by mouth 2 (two) times daily. 04/26/17   Doristine Devoid, PA-C  dicyclomine (BENTYL) 20 MG tablet Take 1 tablet (20 mg total) by mouth 2 (two) times daily. 04/26/17    Doristine Devoid, PA-C  metroNIDAZOLE (FLAGYL) 500 MG tablet Take 1 tablet (500 mg total) by mouth 3 (three) times daily. DO NOT CONSUME ALCOHOL WHILE TAKING THIS MEDICATION. 04/26/17   Ocie Cornfield T, PA-C  ondansetron (ZOFRAN-ODT) 4 MG disintegrating tablet Take 1 tablet (4 mg total) by mouth every 8 (eight) hours as needed for nausea. 04/26/17   Doristine Devoid, PA-C    Family History No family history on file.  Social History Social History   Tobacco Use  . Smoking status: Never Smoker  . Smokeless tobacco: Never Used  Substance Use Topics  . Alcohol use: No    Frequency: Never  . Drug use: No     Allergies   Patient has no known allergies.   Review of Systems Review of Systems  Gastrointestinal: Positive for abdominal pain.   All other systems reviewed and are negative except that which was mentioned in HPI   Physical Exam Updated Vital Signs BP (!) 161/100 (BP Location: Right Arm)   Pulse 74   Temp 98.2 F (36.8 C) (Oral)   Resp 16   Ht 5\' 10"  (1.778 m)   Wt 79.4 kg (175 lb)   SpO2 98%   BMI 25.11 kg/m   Physical Exam  Constitutional: He is oriented to person, place, and time. He appears well-developed and well-nourished. No distress.  uncomfortable  HENT:  Head: Normocephalic and atraumatic.  Moist mucous membranes  Eyes: Conjunctivae are normal. Pupils are equal, round, and reactive to light.  Neck: Neck supple.  Cardiovascular: Normal rate, regular rhythm and normal heart sounds.  No murmur heard. Pulmonary/Chest: Effort normal and breath sounds normal.  Abdominal: He exhibits distension. Bowel sounds are decreased. There is no tenderness.  Mildly firm abdomen with moderate distension, no focal tenderness  Musculoskeletal: He exhibits no edema.  Neurological: He is alert and oriented to person, place, and time.  Fluent speech  Skin: Skin is warm and dry.  Psychiatric: He has a normal mood and affect. Judgment normal.  Nursing note and  vitals reviewed.    ED Treatments / Results  Labs (all labs ordered are listed, but only abnormal results are displayed) Labs Reviewed  COMPREHENSIVE METABOLIC PANEL - Abnormal; Notable for the following components:      Result Value   Sodium 132 (*)    Chloride 100 (*)    Glucose, Bld 129 (*)    Creatinine, Ser 1.49 (*)    GFR calc non Af Amer 52 (*)    GFR calc Af Amer 60 (*)    All other components within normal limits  CBC WITH DIFFERENTIAL/PLATELET - Abnormal; Notable for the following components:   Platelets 427 (*)    All other components within normal limits  I-STAT CG4 LACTIC ACID, ED  I-STAT CG4 LACTIC ACID, ED    EKG  EKG Interpretation None       Radiology Ct Abdomen Pelvis Wo Contrast  Result Date: 04/27/2017 CLINICAL DATA:  57 year old male with suprapubic pain for 2 days. Subsequent encounter. EXAM: CT ABDOMEN AND PELVIS WITHOUT CONTRAST TECHNIQUE: Multidetector CT imaging of the abdomen and pelvis was performed following the standard protocol without IV contrast. COMPARISON:  04/26/2017 CT. FINDINGS: Lower chest: Basilar subsegmental atelectasis. Heart size within normal limits. Hepatobiliary: 11 mm posterior right lobe of indeterminate hypodense lesion. Vicarious excretion of contrast in the gallbladder. Pancreas: Taking into account limitation by non contrast imaging, no pancreatic mass or inflammation. Spleen: Taking into account limitation by non contrast imaging, no splenic mass or enlargement. Adrenals/Urinary Tract: Dense pyramids bilaterally suggestive of medullary sponge disease without obstructing stone or hydronephrosis. 1.7 cm indeterminate lesion anterior aspect midportion right kidney better delineated on prior CT. Urinary bladder decompressed with Foley catheter. No adrenal mass. Stomach/Bowel: Persistent circumferential narrowing of the proximal sigmoid colon. This does not cause complete obstruction as gas has traversed beyond this region however,  proximal large and small bowel dilated consistent with high-grade partial obstruction. Vascular/Lymphatic: No aortic aneurysm.  No adenopathy. Reproductive: Prostate gland unremarkable. Other: Increase amount of free fluid most prominent pelvic region. No free intraperitoneal air. No bowel containing hernia. Musculoskeletal: Coarse calcifications anterior to left pubic symphysis may be related prior trauma. IMPRESSION: Persistent circumferential narrowing of the proximal sigmoid colon suspicious for malignancy (as versus non malignant stricture/focal colitis). This is causing high-grade partial obstruction of proximal colon and small bowel. Increase in amount of free fluid within the pelvis. Foley catheter with decompression of urinary bladder. Medullary sponge kidneys. Previously noted indeterminate liver and right renal lesion not as well delineated on the present exam. Please see prior CT report recommendation. These results were called by telephone at the time of interpretation on 04/27/2017 at 3:05 pm to Dr. Theotis Burrow , who verbally acknowledged these results. Electronically Signed   By: Genia Del M.D.   On: 04/27/2017 15:08   Ct Abdomen Pelvis W Contrast  Addendum Date: 04/26/2017  ADDENDUM REPORT: 04/26/2017 17:15 ADDENDUM: After review of the images, there is possible focal narrowing at the sigmoid colon, series 2, image number 45 with colon upstream to this filled with gas and liquid stools. Findings could be secondary to spasm within the colon or possible small focal area of colitis. Follow-up examination with oral contrast could be helpful if there is high suspicion for obstructive process. Electronically Signed   By: Donavan Foil M.D.   On: 04/26/2017 17:15   Result Date: 04/26/2017 CLINICAL DATA:  Pain across the entire abdomen at the umbilicus with nausea and vomiting EXAM: CT ABDOMEN AND PELVIS WITH CONTRAST TECHNIQUE: Multidetector CT imaging of the abdomen and pelvis was performed  using the standard protocol following bolus administration of intravenous contrast. CONTRAST:  165mL ISOVUE-300 IOPAMIDOL (ISOVUE-300) INJECTION 61% COMPARISON:  None. FINDINGS: Lower chest: Lung bases demonstrate no acute consolidation or pleural effusion. Normal heart size. Hepatobiliary: Subcentimeter hypodensity anterior aspect of the liver too small to further characterize. 11 mm intermediate density lesion posterior right hepatic lobe. No calcified gallstones or biliary dilatation Pancreas: Unremarkable. No pancreatic ductal dilatation or surrounding inflammatory changes. Spleen: Normal in size without focal abnormality. Adrenals/Urinary Tract: Adrenal glands are within normal limits. No hydronephrosis. 17 mm intermediate density lesion mid pole right kidney. Distended urinary bladder Stomach/Bowel: Stomach nonenlarged. Diffuse fluid-filled small and large bowel. Negative appendix. No colon wall thickening. Vascular/Lymphatic: No significant vascular findings are present. No enlarged abdominal or pelvic lymph nodes. Reproductive: Prostate is unremarkable. Other: Negative for free air. Tiny amount of fluid in the right lower quadrant. Musculoskeletal: Coarse dystrophic appearing calcification anterior to the left pubic bone, could relate to prior trauma or hematoma. No acute or suspicious lesion IMPRESSION: 1. Negative for bowel obstruction, appendicitis, or bowel wall thickening. Scattered fluid-filled loops of small bowel with mild gaseous and fluid enlargement of the colon, query enteritis 2. Tiny amount of free fluid in the right lower quadrant 3. Indeterminate hypodense lesions in the right hepatic lobe and right kidney. When the patient is clinically stable and able to follow directions and hold their breath (preferably as an outpatient) further evaluation with dedicated abdominal MRI should be considered. Electronically Signed: By: Donavan Foil M.D. On: 04/26/2017 15:50    Procedures Procedures  (including critical care time)  Medications Ordered in ED Medications  dextrose 5 % and 0.45 % NaCl with KCl 10 mEq/L infusion (not administered)  insulin aspart (novoLOG) injection 0-5 Units (not administered)  sodium chloride 0.9 % bolus 1,000 mL (0 mLs Intravenous Stopped 04/27/17 1221)  ondansetron (ZOFRAN) injection 4 mg (4 mg Intravenous Given 04/27/17 1138)  morphine 4 MG/ML injection 4 mg (4 mg Intravenous Given 04/27/17 1139)  sodium chloride 0.9 % bolus 1,000 mL (0 mLs Intravenous Stopped 04/27/17 1316)     Initial Impression / Assessment and Plan / ED Course  I have reviewed the triage vital signs and the nursing notes.  Pertinent labs & imaging results that were available during my care of the patient were reviewed by me and considered in my medical decision making (see chart for details).    Pt w/ ongoing nominal pain and nausea, not improved after starting Cipro and Flagyl yesterday.  He had moderate distention and hypoactive bowel sounds on exam.  I reviewed his CT scan from yesterday which showed possible area of stricture, difficult to assess because of his bladder distention yesterday.  I discussed risks and benefits of repeat CT scan for evaluation with oral contrast and patient agreed  to proceed.  Labs show normal lactate, creatinine 1.49, normal WBC count.  CT shows narrowing of proximal sigmoid colon concerning for malignant rupture.  He does have free fluid in the pelvis but no free air.  Evidence of high-grade partial obstruction of proximal colon and small bowel.  I discussed these findings with the radiologist and then with general surgery, Dr. Dalbert Batman.  He will see the patient in consultation.  Discussed admission with [facility] hospitalist, Dr. Sheran Fava, appreciate her assistance.  She has accepted the patient in transfer to [facility] for admission and likely GI consultation for future scope.  Will be transferred for further care. Final Clinical Impressions(s) / ED Diagnoses     Final diagnoses:  Colonic stricture [facility])  Partial small bowel obstruction [facility])    ED Discharge Orders    None       Demontez Novack, Wenda Overland, MD 04/27/17 1615

## 2017-04-27 NOTE — Consult Note (Signed)
Reason for Consult: Abdominal pain, colonic obstruction   Referring Physician: [name] is an 57 y.o. male.  HPI: Patient is a generally healthy 57 year old male who about 4-5 weeks ago noted a change in his bowel habits with frequent loose stools.  On questioning he is also noted an occasional small amount of blood.  He developed some mild bloating at the same time.  However over the last 3-4 days he has experienced progressive increased crampy mid abdominal pain which is intermittent and occasionally fairly severe.  This is been associated with nausea and hiccups but no vomiting.  Still having frequent small loose bowel movements.  No fever or chills.  No history of GI complaints.  Past Medical History:  Diagnosis Date  . Diabetes mellitus without complication ([place])     History reviewed. No pertinent surgical history.  No family history on file.  Social History:  reports that  has never smoked. he has never used smokeless tobacco. He reports that he does not drink alcohol or use drugs.  Allergies: No Known Allergies  Current Facility-Administered Medications  Medication Dose Route Frequency Provider Last Rate Last Dose  . dextrose 5 % and 0.45 % NaCl with KCl 10 mEq/L infusion   Intravenous Continuous Short, Mackenzie, MD      . insulin aspart (novoLOG) injection 0-5 Units  0-5 Units Subcutaneous Q4H Short, Mackenzie, MD      . morphine 4 MG/ML injection 4 mg  4 mg Intravenous Q1H PRN Tanna Furry, MD   4 mg at 04/27/17 1624     Results for orders placed or performed during the hospital encounter of 04/27/17 (from the past 48 hour(s))  Comprehensive metabolic panel     Status: Abnormal   Collection Time: 04/27/17 11:30 AM  Result Value Ref Range   Sodium 132 (L) 135 - 145 mmol/L   Potassium 5.0 3.5 - 5.1 mmol/L   Chloride 100 (L) 101 - 111 mmol/L   CO2 27 22 - 32 mmol/L   Glucose, Bld 129 (H) 65 - 99 mg/dL   BUN 20 6 - 20 mg/dL   Creatinine, Ser 1.49 (H)  0.61 - 1.24 mg/dL   Calcium 9.3 8.9 - 10.3 mg/dL   Total Protein 8.0 6.5 - 8.1 g/dL   Albumin 3.7 3.5 - 5.0 g/dL   AST 19 15 - 41 U/L   ALT 17 17 - 63 U/L   Alkaline Phosphatase 93 38 - 126 U/L   Total Bilirubin 0.7 0.3 - 1.2 mg/dL   GFR calc non Af Amer 52 (L) >60 mL/min   GFR calc Af Amer 60 (L) >60 mL/min    Comment: (NOTE) The eGFR has been calculated using the CKD EPI equation. This calculation has not been validated in all clinical situations. eGFR's persistently <60 mL/min signify possible Chronic Kidney Disease.    Anion gap 5 5 - 15  CBC with Differential     Status: Abnormal   Collection Time: 04/27/17 11:30 AM  Result Value Ref Range   WBC 8.9 4.0 - 10.5 K/uL   RBC 4.41 4.22 - 5.81 MIL/uL   Hemoglobin 13.5 13.0 - 17.0 g/dL   HCT 40.6 39.0 - 52.0 %   MCV 92.1 78.0 - 100.0 fL   MCH 30.6 26.0 - 34.0 pg   MCHC 33.3 30.0 - 36.0 g/dL   RDW 13.5 11.5 - 15.5 %   Platelets 427 (H) 150 - 400 K/uL   Neutrophils Relative % 76 %  Neutro Abs 6.7 1.7 - 7.7 K/uL   Lymphocytes Relative 13 %   Lymphs Abs 1.2 0.7 - 4.0 K/uL   Monocytes Relative 11 %   Monocytes Absolute 0.9 0.1 - 1.0 K/uL   Eosinophils Relative 0 %   Eosinophils Absolute 0.0 0.0 - 0.7 K/uL   Basophils Relative 0 %   Basophils Absolute 0.0 0.0 - 0.1 K/uL  I-Stat CG4 Lactic Acid, ED     Status: None   Collection Time: 04/27/17 11:45 AM  Result Value Ref Range   Lactic Acid, Venous 1.56 0.5 - 1.9 mmol/L  I-Stat CG4 Lactic Acid, ED     Status: None   Collection Time: 04/27/17  3:03 PM  Result Value Ref Range   Lactic Acid, Venous 1.71 0.5 - 1.9 mmol/L  CBG monitoring, ED     Status: None   Collection Time: 04/27/17  5:25 PM  Result Value Ref Range   Glucose-Capillary 79 65 - 99 mg/dL  Glucose, capillary     Status: None   Collection Time: 04/27/17  8:13 PM  Result Value Ref Range   Glucose-Capillary 76 65 - 99 mg/dL    Ct Abdomen Pelvis Wo Contrast  Result Date: 04/27/2017 CLINICAL DATA:  57 year old  male with suprapubic pain for 2 days. Subsequent encounter. EXAM: CT ABDOMEN AND PELVIS WITHOUT CONTRAST TECHNIQUE: Multidetector CT imaging of the abdomen and pelvis was performed following the standard protocol without IV contrast. COMPARISON:  04/26/2017 CT. FINDINGS: Lower chest: Basilar subsegmental atelectasis. Heart size within normal limits. Hepatobiliary: 11 mm posterior right lobe of indeterminate hypodense lesion. Vicarious excretion of contrast in the gallbladder. Pancreas: Taking into account limitation by non contrast imaging, no pancreatic mass or inflammation. Spleen: Taking into account limitation by non contrast imaging, no splenic mass or enlargement. Adrenals/Urinary Tract: Dense pyramids bilaterally suggestive of medullary sponge disease without obstructing stone or hydronephrosis. 1.7 cm indeterminate lesion anterior aspect midportion right kidney better delineated on prior CT. Urinary bladder decompressed with Foley catheter. No adrenal mass. Stomach/Bowel: Persistent circumferential narrowing of the proximal sigmoid colon. This does not cause complete obstruction as gas has traversed beyond this region however, proximal large and small bowel dilated consistent with high-grade partial obstruction. Vascular/Lymphatic: No aortic aneurysm.  No adenopathy. Reproductive: Prostate gland unremarkable. Other: Increase amount of free fluid most prominent pelvic region. No free intraperitoneal air. No bowel containing hernia. Musculoskeletal: Coarse calcifications anterior to left pubic symphysis may be related prior trauma. IMPRESSION: Persistent circumferential narrowing of the proximal sigmoid colon suspicious for malignancy (as versus non malignant stricture/focal colitis). This is causing high-grade partial obstruction of proximal colon and small bowel. Increase in amount of free fluid within the pelvis. Foley catheter with decompression of urinary bladder. Medullary sponge kidneys. Previously  noted indeterminate liver and right renal lesion not as well delineated on the present exam. Please see prior CT report recommendation. These results were called by telephone at the time of interpretation on 04/27/2017 at 3:05 pm to Dr. Theotis Burrow , who verbally acknowledged these results. Electronically Signed   By: Genia Del M.D.   On: 04/27/2017 15:08   Ct Abdomen Pelvis W Contrast  Addendum Date: 04/26/2017   ADDENDUM REPORT: 04/26/2017 17:15 ADDENDUM: After review of the images, there is possible focal narrowing at the sigmoid colon, series 2, image number 45 with colon upstream to this filled with gas and liquid stools. Findings could be secondary to spasm within the colon or possible small focal area of colitis. Follow-up examination  with oral contrast could be helpful if there is high suspicion for obstructive process. Electronically Signed   By: Donavan Foil M.D.   On: 04/26/2017 17:15   Result Date: 04/26/2017 CLINICAL DATA:  Pain across the entire abdomen at the umbilicus with nausea and vomiting EXAM: CT ABDOMEN AND PELVIS WITH CONTRAST TECHNIQUE: Multidetector CT imaging of the abdomen and pelvis was performed using the standard protocol following bolus administration of intravenous contrast. CONTRAST:  136m ISOVUE-300 IOPAMIDOL (ISOVUE-300) INJECTION 61% COMPARISON:  None. FINDINGS: Lower chest: Lung bases demonstrate no acute consolidation or pleural effusion. Normal heart size. Hepatobiliary: Subcentimeter hypodensity anterior aspect of the liver too small to further characterize. 11 mm intermediate density lesion posterior right hepatic lobe. No calcified gallstones or biliary dilatation Pancreas: Unremarkable. No pancreatic ductal dilatation or surrounding inflammatory changes. Spleen: Normal in size without focal abnormality. Adrenals/Urinary Tract: Adrenal glands are within normal limits. No hydronephrosis. 17 mm intermediate density lesion mid pole right kidney. Distended urinary  bladder Stomach/Bowel: Stomach nonenlarged. Diffuse fluid-filled small and large bowel. Negative appendix. No colon wall thickening. Vascular/Lymphatic: No significant vascular findings are present. No enlarged abdominal or pelvic lymph nodes. Reproductive: Prostate is unremarkable. Other: Negative for free air. Tiny amount of fluid in the right lower quadrant. Musculoskeletal: Coarse dystrophic appearing calcification anterior to the left pubic bone, could relate to prior trauma or hematoma. No acute or suspicious lesion IMPRESSION: 1. Negative for bowel obstruction, appendicitis, or bowel wall thickening. Scattered fluid-filled loops of small bowel with mild gaseous and fluid enlargement of the colon, query enteritis 2. Tiny amount of free fluid in the right lower quadrant 3. Indeterminate hypodense lesions in the right hepatic lobe and right kidney. When the patient is clinically stable and able to follow directions and hold their breath (preferably as an outpatient) further evaluation with dedicated abdominal MRI should be considered. Electronically Signed: By: KDonavan FoilM.D. On: 04/26/2017 15:50    Review of Systems  Constitutional: Negative for chills, fever and weight loss.  Respiratory: Negative.   Cardiovascular: Negative.   Gastrointestinal: Positive for abdominal pain, blood in stool, diarrhea and nausea. Negative for constipation, melena and vomiting.  Genitourinary: Negative.   Musculoskeletal: Negative.    Blood pressure (!) 171/99, pulse 81, temperature 98.8 F (37.1 C), temperature source Oral, resp. rate 16, height 5' 10"  (1.778 m), weight 79.4 kg (175 lb), SpO2 100 %. Physical Exam General: Alert, well-developed African-American male, uncomfortable Skin: Warm and dry without rash or infection. HEENT: No palpable masses or thyromegaly. Sclera nonicteric. Pupils equal round and reactive. Oropharynx clear. Lymph nodes: No cervical, supraclavicular, or inguinal nodes  palpable. Lungs: Breath sounds clear and equal without increased work of breathing Cardiovascular: Regular rate and rhythm without murmur. No JVD or edema. Peripheral pulses intact. Abdomen: Moderately distended.  Firm.  Mild diffuse tenderness without guarding.. No masses palpable. No organomegaly. No palpable hernias. Extremities: No edema or joint swelling or deformity. No chronic venous stasis changes. Neurologic: Alert and fully oriented. Gait normal.  Assessment/Plan: High-grade partial colonic obstruction sigmoid colon with symptomatic gradual onset over the last 1-2 months.  Suspicious for neoplasm however cannot rule out colitis or benign stricture.  Would place an NG tube if he develops vomiting.  Needs GI consult for urgent unprepped sigmoidoscopy for diagnosis.  Has indeterminant lesion in right lobe of liver and kidney that will require further evaluation.  Likely will require surgical intervention for his colonic obstruction..  This was all discussed with the patient and questions  answered.  Darene Lamer Denea Cheaney 04/27/2017, 8:45 PM

## 2017-04-27 NOTE — ED Triage Notes (Signed)
Suprapubic pain x2 days.  Seen here yesterday for same.  Urinary catheter placed and remains in place.  Urine amber. Pt unable to get in to urology until Friday.  They told him to come back here if pain is still severe.

## 2017-04-27 NOTE — H&P (Signed)
TRH H&P   Patient Demographics:    Keven Osborn, is a 57 y.o. male  MRN: [id number]   DOB - 12/20/1960  Admit Date - 04/27/2017  Outpatient Primary MD for the patient is [facility], [place] Not In  Referring MD/NP/PA: Addison Lank  Outpatient Specialists:  Dr. Tia Masker  (urologist)  Patient coming from: home,  [place], [place], works at [employer] Complaint  Patient presents with  . Abdominal Pain      HPI:    Pike Scantlebury  is a 57 y.o. male, w c/o upper abdominal discomfort, starting 2-3 days.  Slight nausea. Slight emesis.  Denies fever, chills, diarrhea, brbpr, weight loss. Pt presented due to abdominal pain.    In ED,  Wbc 8.9, Hgb 13.5, Plt 427 Bun 20, Creatinine 1.49 Glucose 129  CT scan  IMPRESSION: Persistent circumferential narrowing of the proximal sigmoid colon uspicious for malignancy (as versus non malignant stricture/focal colitis). This is causing high-grade partial obstruction of proximal colon and small bowel. Increase in amount of free fluid within the pelvis.  Foley catheter with decompression of urinary bladder.  Medullary sponge kidneys.  Previously noted indeterminate liver and right renal lesion not as well delineated on the present exam. Please see prior CT report recommendation.  Pt will be admitted for abd pain, and circumferential narrowing of the proximal sigmoid colon suspicious for malignancy.     Review of systems:    In addition to the HPI above, No Fever-chills, No Headache, No changes with Vision or hearing, No problems swallowing food or Liquids, No Chest pain, Cough or Shortness of Breath,  No Blood in stool or Urine, No dysuria, No new skin rashes or bruises, No new joints pains-aches,  No new weakness, tingling, numbness in any extremity, No recent weight gain or loss, No polyuria,  polydypsia or polyphagia, No significant Mental Stressors.  A full 10 point Review of Systems was done, except as stated above, all other Review of Systems were negative.   With Past History of the following :    Past Medical History:  Diagnosis Date  . Diabetes mellitus without complication ([place])       History reviewed. No pertinent surgical history.  No surgeries    Social History:     Social History   Tobacco Use  . Smoking status: Never Smoker  . Smokeless tobacco: Never Used  Substance Use Topics  . Alcohol use: No    Frequency: Never     Lives -  At home  Mobility - walks by self   Family History :     Family History  Problem Relation Age of Onset  . Prostate cancer Father      Home Medications:   Prior to Admission medications   Medication Sig Start Date End Date Taking? Authorizing Provider  Ascorbic Acid (VITAMIN C PO) Take 1 tablet  by mouth daily.   Yes [provider]  ciprofloxacin (CIPRO) 500 MG tablet Take 1 tablet (500 mg total) by mouth 2 (two) times daily. 04/26/17  Yes Leaphart, Zack Seal, PA-C  dicyclomine (BENTYL) 20 MG tablet Take 1 tablet (20 mg total) by mouth 2 (two) times daily. 04/26/17  Yes Leaphart, Zack Seal, PA-C  metroNIDAZOLE (FLAGYL) 500 MG tablet Take 1 tablet (500 mg total) by mouth 3 (three) times daily. DO NOT CONSUME ALCOHOL WHILE TAKING THIS MEDICATION. 04/26/17  Yes Doristine Devoid, PA-C  Multiple Vitamin (ONE-A-DAY MENS PO) Take 1 tablet by mouth daily.   Yes [provider]  ondansetron (ZOFRAN-ODT) 4 MG disintegrating tablet Take 1 tablet (4 mg total) by mouth every 8 (eight) hours as needed for nausea. 04/26/17  Yes Doristine Devoid, PA-C     Allergies:    No Known Allergies   Physical Exam:   Vitals  Blood pressure (!) 171/99, pulse 81, temperature 98.8 F (37.1 C), temperature source Oral, resp. rate 16, height 5\' 10"  (1.778 m), weight 79.4 kg (175 lb), SpO2 100 %.   1. General   lying in bed in NAD,    2. Normal affect and insight, Not Suicidal or Homicidal, Awake Alert, Oriented X 3.  3. No F.N deficits, ALL C.Nerves Intact, Strength 5/5 all 4 extremities, Sensation intact all 4 extremities, Plantars down going.  4. Ears and Eyes appear Normal, Conjunctivae clear, PERRLA. Moist Oral Mucosa.  5. Supple Neck, No JVD, No cervical lymphadenopathy appriciated, No Carotid Bruits.  6. Symmetrical Chest wall movement, Good air movement bilaterally, CTAB.  7. RRR, No Gallops, Rubs or Murmurs, No Parasternal Heave.  8. Positive Bowel Sounds, Abdomen Soft, No tenderness, No organomegaly appriciated,No rebound -guarding or rigidity.  9.  No Cyanosis, Normal Skin Turgor, No Skin Rash or Bruise.  10. Good muscle tone,  joints appear normal , no effusions, Normal ROM.  11. No Palpable Lymph Nodes in Neck or Axillae    Data Review:    CBC Recent Labs  Lab 04/26/17 1337 04/27/17 1130  WBC 12.4* 8.9  HGB 13.6 13.5  HCT 40.1 40.6  PLT 424* 427*  MCV 92.0 92.1  MCH 31.2 30.6  MCHC 33.9 33.3  RDW 13.3 13.5  LYMPHSABS  --  1.2  MONOABS  --  0.9  EOSABS  --  0.0  BASOSABS  --  0.0   ------------------------------------------------------------------------------------------------------------------  Chemistries  Recent Labs  Lab 04/26/17 1337 04/27/17 1130  NA 133* 132*  K 4.2 5.0  CL 99* 100*  CO2 23 27  GLUCOSE 125* 129*  BUN 14 20  CREATININE 1.23 1.49*  CALCIUM 9.6 9.3  AST 20 19  ALT 17 17  ALKPHOS 102 93  BILITOT 0.6 0.7   ------------------------------------------------------------------------------------------------------------------ estimated creatinine clearance is 58.5 mL/min (A) (by C-G formula based on SCr of 1.49 mg/dL (H)). ------------------------------------------------------------------------------------------------------------------ No results for input(s): TSH, T4TOTAL, T3FREE, THYROIDAB in the last 72 hours.  Invalid input(s):  FREET3  Coagulation profile No results for input(s): INR, PROTIME in the last 168 hours. ------------------------------------------------------------------------------------------------------------------- No results for input(s): DDIMER in the last 72 hours. -------------------------------------------------------------------------------------------------------------------  Cardiac Enzymes No results for input(s): CKMB, TROPONINI, MYOGLOBIN in the last 168 hours.  Invalid input(s): CK ------------------------------------------------------------------------------------------------------------------ No results found for: BNP   ---------------------------------------------------------------------------------------------------------------  Urinalysis    Component Value Date/Time   COLORURINE YELLOW 04/26/2017 [place] 04/26/2017 1645   LABSPEC 1.010 04/26/2017 [place] 6.0 04/26/2017 1645  GLUCOSEU NEGATIVE 04/26/2017 1645   HGBUR SMALL (A) 04/26/2017 1645   BILIRUBINUR NEGATIVE 04/26/2017 1645   KETONESUR 15 (A) 04/26/2017 1645   PROTEINUR NEGATIVE 04/26/2017 1645   NITRITE NEGATIVE 04/26/2017 1645   LEUKOCYTESUR NEGATIVE 04/26/2017 1645    ----------------------------------------------------------------------------------------------------------------   Imaging Results:    Ct Abdomen Pelvis Wo Contrast  Result Date: 04/27/2017 CLINICAL DATA:  57 year old male with suprapubic pain for 2 days. Subsequent encounter. EXAM: CT ABDOMEN AND PELVIS WITHOUT CONTRAST TECHNIQUE: Multidetector CT imaging of the abdomen and pelvis was performed following the standard protocol without IV contrast. COMPARISON:  04/26/2017 CT. FINDINGS: Lower chest: Basilar subsegmental atelectasis. Heart size within normal limits. Hepatobiliary: 11 mm posterior right lobe of indeterminate hypodense lesion. Vicarious excretion of contrast in the gallbladder. Pancreas: Taking into account  limitation by non contrast imaging, no pancreatic mass or inflammation. Spleen: Taking into account limitation by non contrast imaging, no splenic mass or enlargement. Adrenals/Urinary Tract: Dense pyramids bilaterally suggestive of medullary sponge disease without obstructing stone or hydronephrosis. 1.7 cm indeterminate lesion anterior aspect midportion right kidney better delineated on prior CT. Urinary bladder decompressed with Foley catheter. No adrenal mass. Stomach/Bowel: Persistent circumferential narrowing of the proximal sigmoid colon. This does not cause complete obstruction as gas has traversed beyond this region however, proximal large and small bowel dilated consistent with high-grade partial obstruction. Vascular/Lymphatic: No aortic aneurysm.  No adenopathy. Reproductive: Prostate gland unremarkable. Other: Increase amount of free fluid most prominent pelvic region. No free intraperitoneal air. No bowel containing hernia. Musculoskeletal: Coarse calcifications anterior to left pubic symphysis may be related prior trauma. IMPRESSION: Persistent circumferential narrowing of the proximal sigmoid colon suspicious for malignancy (as versus non malignant stricture/focal colitis). This is causing high-grade partial obstruction of proximal colon and small bowel. Increase in amount of free fluid within the pelvis. Foley catheter with decompression of urinary bladder. Medullary sponge kidneys. Previously noted indeterminate liver and right renal lesion not as well delineated on the present exam. Please see prior CT report recommendation. These results were called by telephone at the time of interpretation on 04/27/2017 at 3:05 pm to Dr. Theotis Burrow , who verbally acknowledged these results. Electronically Signed   By: Genia Del M.D.   On: 04/27/2017 15:08   Ct Abdomen Pelvis W Contrast  Addendum Date: 04/26/2017   ADDENDUM REPORT: 04/26/2017 17:15 ADDENDUM: After review of the images, there is  possible focal narrowing at the sigmoid colon, series 2, image number 45 with colon upstream to this filled with gas and liquid stools. Findings could be secondary to spasm within the colon or possible small focal area of colitis. Follow-up examination with oral contrast could be helpful if there is high suspicion for obstructive process. Electronically Signed   By: Donavan Foil M.D.   On: 04/26/2017 17:15   Result Date: 04/26/2017 CLINICAL DATA:  Pain across the entire abdomen at the umbilicus with nausea and vomiting EXAM: CT ABDOMEN AND PELVIS WITH CONTRAST TECHNIQUE: Multidetector CT imaging of the abdomen and pelvis was performed using the standard protocol following bolus administration of intravenous contrast. CONTRAST:  183mL ISOVUE-300 IOPAMIDOL (ISOVUE-300) INJECTION 61% COMPARISON:  None. FINDINGS: Lower chest: Lung bases demonstrate no acute consolidation or pleural effusion. Normal heart size. Hepatobiliary: Subcentimeter hypodensity anterior aspect of the liver too small to further characterize. 11 mm intermediate density lesion posterior right hepatic lobe. No calcified gallstones or biliary dilatation Pancreas: Unremarkable. No pancreatic ductal dilatation or surrounding inflammatory changes. Spleen: Normal in size without focal abnormality.  Adrenals/Urinary Tract: Adrenal glands are within normal limits. No hydronephrosis. 17 mm intermediate density lesion mid pole right kidney. Distended urinary bladder Stomach/Bowel: Stomach nonenlarged. Diffuse fluid-filled small and large bowel. Negative appendix. No colon wall thickening. Vascular/Lymphatic: No significant vascular findings are present. No enlarged abdominal or pelvic lymph nodes. Reproductive: Prostate is unremarkable. Other: Negative for free air. Tiny amount of fluid in the right lower quadrant. Musculoskeletal: Coarse dystrophic appearing calcification anterior to the left pubic bone, could relate to prior trauma or hematoma. No acute or  suspicious lesion IMPRESSION: 1. Negative for bowel obstruction, appendicitis, or bowel wall thickening. Scattered fluid-filled loops of small bowel with mild gaseous and fluid enlargement of the colon, query enteritis 2. Tiny amount of free fluid in the right lower quadrant 3. Indeterminate hypodense lesions in the right hepatic lobe and right kidney. When the patient is clinically stable and able to follow directions and hold their breath (preferably as an outpatient) further evaluation with dedicated abdominal MRI should be considered. Electronically Signed: By: Donavan Foil M.D. On: 04/26/2017 15:50       Assessment & Plan:    Principal Problem:   Colonic stricture (HCC) Active Problems:   SBO (small bowel obstruction) (HCC)   Hyponatremia   Renal insufficiency    Colonic stricture Colon/sbo NPO NGT to low intermittent suction Appreciate surgery consult Please call GI consult in am for endoscopy to evaluate stricture  Hyponatremia Hydrate with ns iv  Renal insufficiency Hydrate with ns iv cmp in am  Dm2 fsbs q4h  ISS   DVT Prophylaxis Lovenox - SCDs  AM Labs Ordered, also please review Full Orders  Family Communication: Admission, patients condition and plan of care including tests being ordered have been discussed with the patient  who indicate understanding and agree with the plan and Code Status.  Code Status FULL CODE  Likely DC to  home  Condition GUARDED    Consults called: surgery  Admission status: inpatient  Time spent in minutes : 45   Jani Gravel M.D on 04/27/2017 at 9:39 PM  Between 7am to 7pm - Pager - [phone]   After 7pm go to [email or web address] - password [facility]  [facility] - Office  [phone]

## 2017-04-28 ENCOUNTER — Encounter (HOSPITAL_COMMUNITY): Payer: Self-pay

## 2017-04-28 ENCOUNTER — Encounter (HOSPITAL_COMMUNITY)
Admission: EM | Disposition: A | Discharge status: Home / Self Care | Payer: Self-pay | Source: Home / Self Care | Attending: Internal Medicine

## 2017-04-28 DIAGNOSIS — K6389 Other specified diseases of intestine: Secondary | ICD-10-CM

## 2017-04-28 DIAGNOSIS — K56699 Other intestinal obstruction unspecified as to partial versus complete obstruction: Secondary | ICD-10-CM

## 2017-04-28 DIAGNOSIS — K639 Disease of intestine, unspecified: Secondary | ICD-10-CM

## 2017-04-28 HISTORY — PX: FLEXIBLE SIGMOIDOSCOPY: SHX5431

## 2017-04-28 LAB — CBC
HCT: 36.6 % — ABNORMAL LOW (ref 39.0–52.0)
HEMOGLOBIN: 12.1 g/dL — AB (ref 13.0–17.0)
MCH: 31.1 pg (ref 26.0–34.0)
MCHC: 33.1 g/dL (ref 30.0–36.0)
MCV: 94.1 fL (ref 78.0–100.0)
Platelets: 417 10*3/uL — ABNORMAL HIGH (ref 150–400)
RBC: 3.89 MIL/uL — AB (ref 4.22–5.81)
RDW: 14.2 % (ref 11.5–15.5)
WBC: 9.3 10*3/uL (ref 4.0–10.5)

## 2017-04-28 LAB — COMPREHENSIVE METABOLIC PANEL
ALT: 13 U/L — ABNORMAL LOW (ref 17–63)
ANION GAP: 7 (ref 5–15)
AST: 15 U/L (ref 15–41)
Albumin: 3.4 g/dL — ABNORMAL LOW (ref 3.5–5.0)
Alkaline Phosphatase: 76 U/L (ref 38–126)
BUN: 18 mg/dL (ref 6–20)
CHLORIDE: 102 mmol/L (ref 101–111)
CO2: 27 mmol/L (ref 22–32)
Calcium: 8.7 mg/dL — ABNORMAL LOW (ref 8.9–10.3)
Creatinine, Ser: 1.36 mg/dL — ABNORMAL HIGH (ref 0.61–1.24)
GFR calc non Af Amer: 58 mL/min — ABNORMAL LOW (ref >60)
Glucose, Bld: 107 mg/dL — ABNORMAL HIGH (ref 65–99)
Potassium: 4.1 mmol/L (ref 3.5–5.1)
SODIUM: 136 mmol/L (ref 135–145)
Total Bilirubin: 0.5 mg/dL (ref 0.3–1.2)
Total Protein: 6.7 g/dL (ref 6.5–8.1)

## 2017-04-28 LAB — HEMOGLOBIN A1C
HEMOGLOBIN A1C: 5.6 % (ref 4.8–5.6)
Mean Plasma Glucose: 114.02 mg/dL

## 2017-04-28 LAB — HIV ANTIBODY (ROUTINE TESTING W REFLEX): HIV SCREEN 4TH GENERATION: NONREACTIVE

## 2017-04-28 LAB — GLUCOSE, CAPILLARY
GLUCOSE-CAPILLARY: 78 mg/dL (ref 65–99)
GLUCOSE-CAPILLARY: 94 mg/dL (ref 65–99)
GLUCOSE-CAPILLARY: 99 mg/dL (ref 65–99)
Glucose-Capillary: 85 mg/dL (ref 65–99)
Glucose-Capillary: 85 mg/dL (ref 65–99)
Glucose-Capillary: 90 mg/dL (ref 65–99)

## 2017-04-28 SURGERY — SIGMOIDOSCOPY, FLEXIBLE
Anesthesia: Moderate Sedation

## 2017-04-28 MED ORDER — PHENOL 1.4 % MT LIQD
1.0000 | OROMUCOSAL | Status: DC | PRN
  Filled 2017-04-28: qty 177

## 2017-04-28 MED ORDER — MIDAZOLAM HCL 10 MG/2ML IJ SOLN
INTRAMUSCULAR | Status: DC | PRN
  Administered 2017-04-28 (×2): 2 mg via INTRAVENOUS

## 2017-04-28 MED ORDER — FENTANYL CITRATE (PF) 100 MCG/2ML IJ SOLN
INTRAMUSCULAR | Status: DC | PRN
  Administered 2017-04-28 (×3): 25 ug via INTRAVENOUS

## 2017-04-28 MED ORDER — SPOT INK MARKER SYRINGE KIT
PACK | SUBMUCOSAL | Status: DC | PRN
  Administered 2017-04-28: 5 mL via SUBMUCOSAL

## 2017-04-28 MED ORDER — CHLORHEXIDINE GLUCONATE CLOTH 2 % EX PADS
6.0000 | MEDICATED_PAD | Freq: Once | CUTANEOUS | Status: AC
  Administered 2017-04-28: 6 via TOPICAL

## 2017-04-28 MED ORDER — ENOXAPARIN SODIUM 40 MG/0.4ML ~~LOC~~ SOLN
40.0000 mg | SUBCUTANEOUS | Status: AC
  Administered 2017-04-28: 40 mg via SUBCUTANEOUS
  Filled 2017-04-28: qty 0.4

## 2017-04-28 MED ORDER — HYDRALAZINE HCL 20 MG/ML IJ SOLN
5.0000 mg | Freq: Once | INTRAMUSCULAR | Status: AC
  Administered 2017-04-28: 5 mg via INTRAVENOUS
  Filled 2017-04-28: qty 1

## 2017-04-28 MED ORDER — SPOT INK MARKER SYRINGE KIT
PACK | SUBMUCOSAL | Status: AC
  Filled 2017-04-28: qty 5

## 2017-04-28 MED ORDER — MIDAZOLAM HCL 5 MG/ML IJ SOLN
INTRAMUSCULAR | Status: AC
  Filled 2017-04-28: qty 2

## 2017-04-28 MED ORDER — HYDRALAZINE HCL 20 MG/ML IJ SOLN
10.0000 mg | Freq: Four times a day (QID) | INTRAMUSCULAR | Status: DC | PRN
  Administered 2017-04-30 – 2017-05-01 (×2): 10 mg via INTRAVENOUS
  Filled 2017-04-28 (×2): qty 1

## 2017-04-28 MED ORDER — SODIUM CHLORIDE 0.9 % IV SOLN
INTRAVENOUS | Status: DC
  Administered 2017-04-28 – 2017-04-29 (×2): via INTRAVENOUS

## 2017-04-28 MED ORDER — FENTANYL CITRATE (PF) 100 MCG/2ML IJ SOLN
INTRAMUSCULAR | Status: AC
Start: 2017-04-28 — End: 2017-04-28
  Filled 2017-04-28: qty 2

## 2017-04-28 MED ORDER — DEXTROSE 5 % IV SOLN
2.0000 g | INTRAVENOUS | Status: AC
  Administered 2017-04-29: 2 g via INTRAVENOUS
  Filled 2017-04-28: qty 2

## 2017-04-28 MED ORDER — POLYETHYLENE GLYCOL 3350 17 GM/SCOOP PO POWD
1.0000 | Freq: Once | ORAL | Status: DC
  Filled 2017-04-28: qty 255

## 2017-04-28 MED ORDER — CHLORHEXIDINE GLUCONATE 0.12 % MT SOLN
15.0000 mL | Freq: Two times a day (BID) | OROMUCOSAL | Status: DC
  Administered 2017-04-28 – 2017-05-03 (×11): 15 mL via OROMUCOSAL
  Filled 2017-04-28 (×11): qty 15

## 2017-04-28 MED ORDER — SODIUM CHLORIDE 0.9 % IV SOLN: INTRAVENOUS | Status: DC

## 2017-04-28 MED ORDER — DEXTROSE 5 % IV SOLN
2.0000 g | INTRAVENOUS | Status: DC
  Filled 2017-04-28: qty 2

## 2017-04-28 MED ORDER — MORPHINE SULFATE (PF) 4 MG/ML IV SOLN
4.0000 mg | INTRAVENOUS | Status: DC | PRN
  Administered 2017-04-28 – 2017-04-29 (×2): 4 mg via INTRAVENOUS
  Filled 2017-04-28 (×2): qty 1

## 2017-04-28 MED ORDER — CHLORHEXIDINE GLUCONATE CLOTH 2 % EX PADS: 6.0000 | MEDICATED_PAD | Freq: Once | CUTANEOUS | Status: DC

## 2017-04-28 NOTE — Progress Notes (Signed)
Subjective: Alert and very cooperative. Says his pain is a little bit better NG tube and Foley catheter in place  History and imaging studies reviewed.  Sounds like gradual onset obstructive symptoms over the past month and significant worsening over the past 3-4 days. CT  scan shows high-grade partial colonic obstruction of sigmoid colon.  Question whether this is due to a small neoplastic mass or benign stricture.  No history colon disease in past  I told him that our plan is to get urgent colonoscopy to see if we can identify the site and nature of obstruction.  I told him that he would need an operation within 24 hours, most likely a segmental colon resection and temporary colostomy due to the degree of obstruction.  He understands these issues.  Objective: Vital signs in last 24 hours: Temp:  [98.2 F (36.8 C)-99 F (37.2 C)] 98.6 F (37 C) (01/16 0501) Pulse Rate:  [60-84] 80 (01/16 0550) Resp:  [16] 16 (01/16 0501) BP: (156-175)/(85-108) 167/108 (01/16 0550) SpO2:  [97 %-100 %] 100 % (01/16 0501) Weight:  [79.4 kg (175 lb)] 79.4 kg (175 lb) (01/15 1055) Last BM Date: 04/25/17  Intake/Output from previous day: 01/15 0701 - 01/16 0700 In: 2331.7 [I.V.:331.7; IV Piggyback:2000] Out: 650 [Urine:500; Emesis/NG output:150] Intake/Output this shift: Total I/O In: 331.7 [I.V.:331.7] Out: 350 [Urine:200; Emesis/NG output:150]    PE: General: Alert.  Very cooperative.  Oriented.  Mild distress Lungs: Clear to auscultation bilaterally Abdomen: Mild to moderate distention.  Mild diffuse tenderness without guarding or peritoneal signs.  No scars.  No hernias.  No masses. Extremity is: No edema.  No tenderness.    Lab Results:  Recent Labs    04/27/17 1130 04/28/17 0449  WBC 8.9 9.3  HGB 13.5 12.1*  HCT 40.6 36.6*  PLT 427* 417*   BMET Recent Labs    04/27/17 1130 04/28/17 0449  NA 132* 136  K 5.0 4.1  CL 100* 102  CO2 27 27  GLUCOSE 129* 107*  BUN 20 18   CREATININE 1.49* 1.36*  CALCIUM 9.3 8.7*   PT/INR No results for input(s): LABPROT, INR in the last 72 hours. ABG No results for input(s): PHART, HCO3 in the last 72 hours.  Invalid input(s): PCO2, PO2  Studies/Results: Ct Abdomen Pelvis Wo Contrast  Result Date: 04/27/2017 CLINICAL DATA:  57 year old male with suprapubic pain for 2 days. Subsequent encounter. EXAM: CT ABDOMEN AND PELVIS WITHOUT CONTRAST TECHNIQUE: Multidetector CT imaging of the abdomen and pelvis was performed following the standard protocol without IV contrast. COMPARISON:  04/26/2017 CT. FINDINGS: Lower chest: Basilar subsegmental atelectasis. Heart size within normal limits. Hepatobiliary: 11 mm posterior right lobe of indeterminate hypodense lesion. Vicarious excretion of contrast in the gallbladder. Pancreas: Taking into account limitation by non contrast imaging, no pancreatic mass or inflammation. Spleen: Taking into account limitation by non contrast imaging, no splenic mass or enlargement. Adrenals/Urinary Tract: Dense pyramids bilaterally suggestive of medullary sponge disease without obstructing stone or hydronephrosis. 1.7 cm indeterminate lesion anterior aspect midportion right kidney better delineated on prior CT. Urinary bladder decompressed with Foley catheter. No adrenal mass. Stomach/Bowel: Persistent circumferential narrowing of the proximal sigmoid colon. This does not cause complete obstruction as gas has traversed beyond this region however, proximal large and small bowel dilated consistent with high-grade partial obstruction. Vascular/Lymphatic: No aortic aneurysm.  No adenopathy. Reproductive: Prostate gland unremarkable. Other: Increase amount of free fluid most prominent pelvic region. No free intraperitoneal air. No bowel containing hernia. Musculoskeletal:  Coarse calcifications anterior to left pubic symphysis may be related prior trauma. IMPRESSION: Persistent circumferential narrowing of the proximal  sigmoid colon suspicious for malignancy (as versus non malignant stricture/focal colitis). This is causing high-grade partial obstruction of proximal colon and small bowel. Increase in amount of free fluid within the pelvis. Foley catheter with decompression of urinary bladder. Medullary sponge kidneys. Previously noted indeterminate liver and right renal lesion not as well delineated on the present exam. Please see prior CT report recommendation. These results were called by telephone at the time of interpretation on 04/27/2017 at 3:05 pm to Dr. Theotis Burrow , who verbally acknowledged these results. Electronically Signed   By: Genia Del M.D.   On: 04/27/2017 15:08   Ct Abdomen Pelvis W Contrast  Addendum Date: 04/26/2017   ADDENDUM REPORT: 04/26/2017 17:15 ADDENDUM: After review of the images, there is possible focal narrowing at the sigmoid colon, series 2, image number 45 with colon upstream to this filled with gas and liquid stools. Findings could be secondary to spasm within the colon or possible small focal area of colitis. Follow-up examination with oral contrast could be helpful if there is high suspicion for obstructive process. Electronically Signed   By: Donavan Foil M.D.   On: 04/26/2017 17:15   Result Date: 04/26/2017 CLINICAL DATA:  Pain across the entire abdomen at the umbilicus with nausea and vomiting EXAM: CT ABDOMEN AND PELVIS WITH CONTRAST TECHNIQUE: Multidetector CT imaging of the abdomen and pelvis was performed using the standard protocol following bolus administration of intravenous contrast. CONTRAST:  177mL ISOVUE-300 IOPAMIDOL (ISOVUE-300) INJECTION 61% COMPARISON:  None. FINDINGS: Lower chest: Lung bases demonstrate no acute consolidation or pleural effusion. Normal heart size. Hepatobiliary: Subcentimeter hypodensity anterior aspect of the liver too small to further characterize. 11 mm intermediate density lesion posterior right hepatic lobe. No calcified gallstones or  biliary dilatation Pancreas: Unremarkable. No pancreatic ductal dilatation or surrounding inflammatory changes. Spleen: Normal in size without focal abnormality. Adrenals/Urinary Tract: Adrenal glands are within normal limits. No hydronephrosis. 17 mm intermediate density lesion mid pole right kidney. Distended urinary bladder Stomach/Bowel: Stomach nonenlarged. Diffuse fluid-filled small and large bowel. Negative appendix. No colon wall thickening. Vascular/Lymphatic: No significant vascular findings are present. No enlarged abdominal or pelvic lymph nodes. Reproductive: Prostate is unremarkable. Other: Negative for free air. Tiny amount of fluid in the right lower quadrant. Musculoskeletal: Coarse dystrophic appearing calcification anterior to the left pubic bone, could relate to prior trauma or hematoma. No acute or suspicious lesion IMPRESSION: 1. Negative for bowel obstruction, appendicitis, or bowel wall thickening. Scattered fluid-filled loops of small bowel with mild gaseous and fluid enlargement of the colon, query enteritis 2. Tiny amount of free fluid in the right lower quadrant 3. Indeterminate hypodense lesions in the right hepatic lobe and right kidney. When the patient is clinically stable and able to follow directions and hold their breath (preferably as an outpatient) further evaluation with dedicated abdominal MRI should be considered. Electronically Signed: By: Donavan Foil M.D. On: 04/26/2017 15:50    Anti-infectives: Anti-infectives (From admission, onward)   None      Assessment/Plan:  High-grade partial sigmoid colon obstruction.  Neoplasia more likely than benign stricture but cannot be sure. Continue NG and Foley Consult GI for urgent unprepped sigmoidoscopy for diagnosis today Proceed with surgical intervention within 24 hours.  He was advised that due to the high-grade obstruction he would likely need segmental resection with temporary colostomy.  He understands these  issues  Small  liver mass, posterior right lobe, indeterminate Small right renal mass.  Cyst versus solid.  Will need urologic follow-up  Hypertension.  RN to notify internal medicine for management  Repeat lab work CEA   LOS: 1 day    [name] 04/28/2017

## 2017-04-28 NOTE — H&P (View-Only) (Signed)
Subjective: Alert and very cooperative. Says his pain is a little bit better NG tube and Foley catheter in place  History and imaging studies reviewed.  Sounds like gradual onset obstructive symptoms over the past month and significant worsening over the past 3-4 days. CT  scan shows high-grade partial colonic obstruction of sigmoid colon.  Question whether this is due to a small neoplastic mass or benign stricture.  No history colon disease in past  I told him that our plan is to get urgent colonoscopy to see if we can identify the site and nature of obstruction.  I told him that he would need an operation within 24 hours, most likely a segmental colon resection and temporary colostomy due to the degree of obstruction.  He understands these issues.  Objective: Vital signs in last 24 hours: Temp:  [98.2 F (36.8 C)-99 F (37.2 C)] 98.6 F (37 C) (01/16 0501) Pulse Rate:  [60-84] 80 (01/16 0550) Resp:  [16] 16 (01/16 0501) BP: (156-175)/(85-108) 167/108 (01/16 0550) SpO2:  [97 %-100 %] 100 % (01/16 0501) Weight:  [79.4 kg (175 lb)] 79.4 kg (175 lb) (01/15 1055) Last BM Date: 04/25/17  Intake/Output from previous day: 01/15 0701 - 01/16 0700 In: 2331.7 [I.V.:331.7; IV Piggyback:2000] Out: 650 [Urine:500; Emesis/NG output:150] Intake/Output this shift: Total I/O In: 331.7 [I.V.:331.7] Out: 350 [Urine:200; Emesis/NG output:150]    PE: General: Alert.  Very cooperative.  Oriented.  Mild distress Lungs: Clear to auscultation bilaterally Abdomen: Mild to moderate distention.  Mild diffuse tenderness without guarding or peritoneal signs.  No scars.  No hernias.  No masses. Extremity is: No edema.  No tenderness.    Lab Results:  Recent Labs    04/27/17 1130 04/28/17 0449  WBC 8.9 9.3  HGB 13.5 12.1*  HCT 40.6 36.6*  PLT 427* 417*   BMET Recent Labs    04/27/17 1130 04/28/17 0449  NA 132* 136  K 5.0 4.1  CL 100* 102  CO2 27 27  GLUCOSE 129* 107*  BUN 20 18   CREATININE 1.49* 1.36*  CALCIUM 9.3 8.7*   PT/INR No results for input(s): LABPROT, INR in the last 72 hours. ABG No results for input(s): PHART, HCO3 in the last 72 hours.  Invalid input(s): PCO2, PO2  Studies/Results: Ct Abdomen Pelvis Wo Contrast  Result Date: 04/27/2017 CLINICAL DATA:  57 year old male with suprapubic pain for 2 days. Subsequent encounter. EXAM: CT ABDOMEN AND PELVIS WITHOUT CONTRAST TECHNIQUE: Multidetector CT imaging of the abdomen and pelvis was performed following the standard protocol without IV contrast. COMPARISON:  04/26/2017 CT. FINDINGS: Lower chest: Basilar subsegmental atelectasis. Heart size within normal limits. Hepatobiliary: 11 mm posterior right lobe of indeterminate hypodense lesion. Vicarious excretion of contrast in the gallbladder. Pancreas: Taking into account limitation by non contrast imaging, no pancreatic mass or inflammation. Spleen: Taking into account limitation by non contrast imaging, no splenic mass or enlargement. Adrenals/Urinary Tract: Dense pyramids bilaterally suggestive of medullary sponge disease without obstructing stone or hydronephrosis. 1.7 cm indeterminate lesion anterior aspect midportion right kidney better delineated on prior CT. Urinary bladder decompressed with Foley catheter. No adrenal mass. Stomach/Bowel: Persistent circumferential narrowing of the proximal sigmoid colon. This does not cause complete obstruction as gas has traversed beyond this region however, proximal large and small bowel dilated consistent with high-grade partial obstruction. Vascular/Lymphatic: No aortic aneurysm.  No adenopathy. Reproductive: Prostate gland unremarkable. Other: Increase amount of free fluid most prominent pelvic region. No free intraperitoneal air. No bowel containing hernia. Musculoskeletal:  Coarse calcifications anterior to left pubic symphysis may be related prior trauma. IMPRESSION: Persistent circumferential narrowing of the proximal  sigmoid colon suspicious for malignancy (as versus non malignant stricture/focal colitis). This is causing high-grade partial obstruction of proximal colon and small bowel. Increase in amount of free fluid within the pelvis. Foley catheter with decompression of urinary bladder. Medullary sponge kidneys. Previously noted indeterminate liver and right renal lesion not as well delineated on the present exam. Please see prior CT report recommendation. These results were called by telephone at the time of interpretation on 04/27/2017 at 3:05 pm to Dr. Theotis Burrow , who verbally acknowledged these results. Electronically Signed   By: Genia Del M.D.   On: 04/27/2017 15:08   Ct Abdomen Pelvis W Contrast  Addendum Date: 04/26/2017   ADDENDUM REPORT: 04/26/2017 17:15 ADDENDUM: After review of the images, there is possible focal narrowing at the sigmoid colon, series 2, image number 45 with colon upstream to this filled with gas and liquid stools. Findings could be secondary to spasm within the colon or possible small focal area of colitis. Follow-up examination with oral contrast could be helpful if there is high suspicion for obstructive process. Electronically Signed   By: Donavan Foil M.D.   On: 04/26/2017 17:15   Result Date: 04/26/2017 CLINICAL DATA:  Pain across the entire abdomen at the umbilicus with nausea and vomiting EXAM: CT ABDOMEN AND PELVIS WITH CONTRAST TECHNIQUE: Multidetector CT imaging of the abdomen and pelvis was performed using the standard protocol following bolus administration of intravenous contrast. CONTRAST:  170mL ISOVUE-300 IOPAMIDOL (ISOVUE-300) INJECTION 61% COMPARISON:  None. FINDINGS: Lower chest: Lung bases demonstrate no acute consolidation or pleural effusion. Normal heart size. Hepatobiliary: Subcentimeter hypodensity anterior aspect of the liver too small to further characterize. 11 mm intermediate density lesion posterior right hepatic lobe. No calcified gallstones or  biliary dilatation Pancreas: Unremarkable. No pancreatic ductal dilatation or surrounding inflammatory changes. Spleen: Normal in size without focal abnormality. Adrenals/Urinary Tract: Adrenal glands are within normal limits. No hydronephrosis. 17 mm intermediate density lesion mid pole right kidney. Distended urinary bladder Stomach/Bowel: Stomach nonenlarged. Diffuse fluid-filled small and large bowel. Negative appendix. No colon wall thickening. Vascular/Lymphatic: No significant vascular findings are present. No enlarged abdominal or pelvic lymph nodes. Reproductive: Prostate is unremarkable. Other: Negative for free air. Tiny amount of fluid in the right lower quadrant. Musculoskeletal: Coarse dystrophic appearing calcification anterior to the left pubic bone, could relate to prior trauma or hematoma. No acute or suspicious lesion IMPRESSION: 1. Negative for bowel obstruction, appendicitis, or bowel wall thickening. Scattered fluid-filled loops of small bowel with mild gaseous and fluid enlargement of the colon, query enteritis 2. Tiny amount of free fluid in the right lower quadrant 3. Indeterminate hypodense lesions in the right hepatic lobe and right kidney. When the patient is clinically stable and able to follow directions and hold their breath (preferably as an outpatient) further evaluation with dedicated abdominal MRI should be considered. Electronically Signed: By: Donavan Foil M.D. On: 04/26/2017 15:50    Anti-infectives: Anti-infectives (From admission, onward)   None      Assessment/Plan:  High-grade partial sigmoid colon obstruction.  Neoplasia more likely than benign stricture but cannot be sure. Continue NG and Foley Consult GI for urgent unprepped sigmoidoscopy for diagnosis today Proceed with surgical intervention within 24 hours.  He was advised that due to the high-grade obstruction he would likely need segmental resection with temporary colostomy.  He understands these  issues  Small  liver mass, posterior right lobe, indeterminate Small right renal mass.  Cyst versus solid.  Will need urologic follow-up  Hypertension.  RN to notify internal medicine for management  Repeat lab work CEA   LOS: 1 day    [name] 04/28/2017

## 2017-04-28 NOTE — Op Note (Signed)
[facility] Patient Name: Harry Avila Procedure Date: 04/28/2017 MRN: [id number] Attending MD: Mauri Pole , MD Date of Birth: September 17, 1960 CSN: [id number] Age: 57 Admit Type: Inpatient Procedure:                Flexible Sigmoidoscopy Indications:              Generalized abdominal pain, Abnormal CT of the GI                            tract, Colonic obstruction Providers:                Mauri Pole, MD, Laverta Baltimore RN, RN,                            Alan Mulder, Technician Referring MD:              Medicines:                Fentanyl 75 micrograms IV, Midazolam 4 mg IV Complications:            No immediate complications. Estimated Blood Loss:     Estimated blood loss was minimal. Procedure:                Pre-Anesthesia Assessment:                           - Prior to the procedure, a History and Physical                            was performed, and patient medications and                            allergies were reviewed. The patient's tolerance of                            previous anesthesia was also reviewed. The risks                            and benefits of the procedure and the sedation                            options and risks were discussed with the patient.                            All questions were answered, and informed consent                            was obtained. Prior Anticoagulants: The patient                            last took Lovenox (enoxaparin) 1 day prior to the                            procedure. ASA Grade Assessment: III - A patient  with severe systemic disease. After reviewing the                            risks and benefits, the patient was deemed in                            satisfactory condition to undergo the procedure.                           After obtaining informed consent, the scope was                            passed under direct vision. The was  introduced                            through the anus and advanced to the the sigmoid                            colon. The flexible sigmoidoscopy was accomplished                            without difficulty. The patient tolerated the                            procedure well. The flexible sigmoidoscopy was                            technically difficult and complex due to poor bowel                            prep with stool present. Scope In: 3:33:36 PM Scope Out: 3:52:46 PM Total Procedure Duration: 0 hours 19 minutes 10 seconds  Findings:      The perianal and digital rectal examinations were normal.      An infiltrative completely obstructing large mass was found in the       distal sigmoid colon at 30cm from anal verge. The mass was       circumferential. Oozing was present. This was biopsied with a cold       forceps for histology. 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 Biopsied. Tattooed. Moderate Sedation:      Moderate (conscious) sedation was administered by the endoscopy nurse       and supervised by the endoscopist. The following parameters were       monitored: oxygen saturation, heart rate, blood pressure, and response       to care. Total physician intraservice time was 20 minutes. Recommendation:           - NPO.                           -  Continue present medications.                           - Await pathology results.                           - Plan for surgery tomorrow by Dr Dalbert Batman Procedure Code(s):        --- Professional ---                           [phone], Sigmoidoscopy, flexible; with biopsy, single                            or multiple                           [id number], Sigmoidoscopy, flexible; with directed                            submucosal injection(s), any  substance                           [id number], Moderate sedation services provided by the                            same physician or other qualified health care                            professional performing a gastrointestinal                            endoscopic service that sedation supports,                            requiring the presence of an independent trained                            observer to assist in the monitoring of the                            patient's level of consciousness and physiological                            status; initial 15 minutes of intra-service time;                            patient age 78 years or older (additional time may                            be reported with [phone], as appropriate) Diagnosis Code(s):        --- Professional ---                           D49.0, Neoplasm of unspecified behavior of  digestive system                           R10.84, Generalized abdominal pain                           K56.60, Unspecified intestinal obstruction                           R93.3, Abnormal findings on diagnostic imaging of                            other parts of digestive tract CPT copyright 2016 [employer]. All rights reserved. The codes documented in this report are preliminary and upon coder review may  be revised to meet current compliance requirements. Mauri Pole, MD 04/28/2017 4:03:09 PM This report has been signed electronically. Number of Addenda: 0

## 2017-04-28 NOTE — Progress Notes (Signed)
PROGRESS NOTE    Harry Avila  XFG:[id number] DOB: [birth year] DOA: 04/27/2017 PCP: System, Pcp Not In  Brief Narrative: 57 year old male with no significant past medical history admitted with abdominal pain nausea.  Patient noticed a change in his bowel habits with frequent loose bowels.  He does complain of increasing mid abdominal pain.  He was found to have proximal sigmoid colon circumferential narrowing.  CT scan of the abdomenPersistent circumferential narrowing of the proximal sigmoid colon uspicious for malignancy (as versus non malignant stricture/focal colitis). This is causing high-grade partial obstruction of proximal colon and small bowel. Increase in amount of free fluid within the pelvis.  Foley catheter with decompression of urinary bladder.  Medullary sponge kidneys.  Previously noted indeterminate liver and right renal lesion not as well delineated on the present exam. Please see prior CT report recommendation. Patient seen in consult by surgery.  GI consult placed today 04/28/2017 patient resting in bed with NG tube in place  .  He does complain of abdominal pain.   Assessment & Plan:   Principal Problem:   Colonic stricture (HCC) Active Problems:   SBO (small bowel obstruction) (HCC)   Hyponatremia   Renal insufficiency  1] partial colonic obstruction of the sigmoid colon-DVT and GI and surgery recommendations.  Question malignancy versus stricture.  Continue NG tube suctioning.  IV fluids.  2] hyponatremia sodium improved to 136 with IV hydration.  3] AK I-creatinine down to 1.36 continue IV hydration.   DVT prophylaxis: lovenox Code Status:full Family Communication: No family available. Disposition Plan:.tbd   Consultants: Surgery/GI  Procedures: None none Antimicrobials: None  Subjective: Complains of abdominal pain no diarrhea has NG tube in place.  Objective: Vitals:   04/28/17 0501 04/28/17 0550 04/28/17 0948 04/28/17 1400  BP:  (!) 175/90 (!) 167/108 (!) 149/84 (!) 164/86  Pulse: 81 80 92 88  Resp: 16  14 14   Temp: 98.6 F (37 C)  98.8 F (37.1 C) 98 F (36.7 C)  TempSrc: Oral  Oral Oral  SpO2: 100%  100% 100%  Weight:      Height:        Intake/Output Summary (Last 24 hours) at 04/28/2017 1408 Last data filed at 04/28/2017 0656 Gross per 24 hour  Intake 755 ml  Output 600 ml  Net 155 ml   Filed Weights   04/27/17 1055 04/28/17 0500  Weight: 79.4 kg (175 lb) 83.6 kg (184 lb 4.9 oz)    Examination:  General exam: Appears calm and comfortable  Respiratory system: Clear to auscultation. Respiratory effort normal. Cardiovascular system: S1 & S2 heard, RRR. No JVD, murmurs, rubs, gallops or clicks. No pedal edema. Gastrointestinal system: Abdomen is distended, soft and tender. No organomegaly or masses felt. Normal bowel sounds heard. Central nervous system: Alert and oriented. No focal neurological deficits. Extremities: Symmetric 5 x 5 power. Skin: No rashes, lesions or ulcers Psychiatry: Judgement and insight appear normal. Mood & affect appropriate.     Data Reviewed: I have personally reviewed following labs and imaging studies  CBC: Recent Labs  Lab 04/26/17 1337 04/27/17 1130 04/28/17 0449  WBC 12.4* 8.9 9.3  NEUTROABS  --  6.7  --   HGB 13.6 13.5 12.1*  HCT 40.1 40.6 36.6*  MCV 92.0 92.1 94.1  PLT 424* 427* 967*   Basic Metabolic Panel: Recent Labs  Lab 04/26/17 1337 04/27/17 1130 04/28/17 0449  NA 133* 132* 136  K 4.2 5.0 4.1  CL 99* 100* 102  CO2 23  27 27  GLUCOSE 125* 129* 107*  BUN 14 20 18   CREATININE 1.23 1.49* 1.36*  CALCIUM 9.6 9.3 8.7*   GFR: Estimated Creatinine Clearance: 64.1 mL/min (A) (by C-G formula based on SCr of 1.36 mg/dL (H)). Liver Function Tests: Recent Labs  Lab 04/26/17 1337 04/27/17 1130 04/28/17 0449  AST 20 19 15   ALT 17 17 13*  ALKPHOS 102 93 76  BILITOT 0.6 0.7 0.5  PROT 8.6* 8.0 6.7  ALBUMIN 4.1 3.7 3.4*   Recent Labs  Lab  04/26/17 1337  LIPASE 23   No results for input(s): AMMONIA in the last 168 hours. Coagulation Profile: No results for input(s): INR, PROTIME in the last 168 hours. Cardiac Enzymes: No results for input(s): CKTOTAL, CKMB, CKMBINDEX, TROPONINI in the last 168 hours. BNP (last 3 results) No results for input(s): PROBNP in the last 8760 hours. HbA1C: Recent Labs    04/28/17 0449  HGBA1C 5.6   CBG: Recent Labs  Lab 04/27/17 2013 04/27/17 2333 04/28/17 0336 04/28/17 0811 04/28/17 1203  GLUCAP 76 97 85 78 85   Lipid Profile: No results for input(s): CHOL, HDL, LDLCALC, TRIG, CHOLHDL, LDLDIRECT in the last 72 hours. Thyroid Function Tests: No results for input(s): TSH, T4TOTAL, FREET4, T3FREE, THYROIDAB in the last 72 hours. Anemia Panel: No results for input(s): VITAMINB12, FOLATE, FERRITIN, TIBC, IRON, RETICCTPCT in the last 72 hours. Sepsis Labs: Recent Labs  Lab 04/27/17 1145 04/27/17 1503  LATICACIDVEN 1.56 1.71    No results found for this or any previous visit (from the past 240 hour(s)).       Radiology Studies: Ct Abdomen Pelvis Wo Contrast  Result Date: 04/27/2017 CLINICAL DATA:  57 year old male with suprapubic pain for 2 days. Subsequent encounter. EXAM: CT ABDOMEN AND PELVIS WITHOUT CONTRAST TECHNIQUE: Multidetector CT imaging of the abdomen and pelvis was performed following the standard protocol without IV contrast. COMPARISON:  04/26/2017 CT. FINDINGS: Lower chest: Basilar subsegmental atelectasis. Heart size within normal limits. Hepatobiliary: 11 mm posterior right lobe of indeterminate hypodense lesion. Vicarious excretion of contrast in the gallbladder. Pancreas: Taking into account limitation by non contrast imaging, no pancreatic mass or inflammation. Spleen: Taking into account limitation by non contrast imaging, no splenic mass or enlargement. Adrenals/Urinary Tract: Dense pyramids bilaterally suggestive of medullary sponge disease without  obstructing stone or hydronephrosis. 1.7 cm indeterminate lesion anterior aspect midportion right kidney better delineated on prior CT. Urinary bladder decompressed with Foley catheter. No adrenal mass. Stomach/Bowel: Persistent circumferential narrowing of the proximal sigmoid colon. This does not cause complete obstruction as gas has traversed beyond this region however, proximal large and small bowel dilated consistent with high-grade partial obstruction. Vascular/Lymphatic: No aortic aneurysm.  No adenopathy. Reproductive: Prostate gland unremarkable. Other: Increase amount of free fluid most prominent pelvic region. No free intraperitoneal air. No bowel containing hernia. Musculoskeletal: Coarse calcifications anterior to left pubic symphysis may be related prior trauma. IMPRESSION: Persistent circumferential narrowing of the proximal sigmoid colon suspicious for malignancy (as versus non malignant stricture/focal colitis). This is causing high-grade partial obstruction of proximal colon and small bowel. Increase in amount of free fluid within the pelvis. Foley catheter with decompression of urinary bladder. Medullary sponge kidneys. Previously noted indeterminate liver and right renal lesion not as well delineated on the present exam. Please see prior CT report recommendation. These results were called by telephone at the time of interpretation on 04/27/2017 at 3:05 pm to Dr. Theotis Burrow , who verbally acknowledged these results. Electronically Signed  By: Genia Del M.D.   On: 04/27/2017 15:08   Ct Abdomen Pelvis W Contrast  Addendum Date: 04/26/2017   ADDENDUM REPORT: 04/26/2017 17:15 ADDENDUM: After review of the images, there is possible focal narrowing at the sigmoid colon, series 2, image number 45 with colon upstream to this filled with gas and liquid stools. Findings could be secondary to spasm within the colon or possible small focal area of colitis. Follow-up examination with oral contrast  could be helpful if there is high suspicion for obstructive process. Electronically Signed   By: Donavan Foil M.D.   On: 04/26/2017 17:15   Result Date: 04/26/2017 CLINICAL DATA:  Pain across the entire abdomen at the umbilicus with nausea and vomiting EXAM: CT ABDOMEN AND PELVIS WITH CONTRAST TECHNIQUE: Multidetector CT imaging of the abdomen and pelvis was performed using the standard protocol following bolus administration of intravenous contrast. CONTRAST:  154mL ISOVUE-300 IOPAMIDOL (ISOVUE-300) INJECTION 61% COMPARISON:  None. FINDINGS: Lower chest: Lung bases demonstrate no acute consolidation or pleural effusion. Normal heart size. Hepatobiliary: Subcentimeter hypodensity anterior aspect of the liver too small to further characterize. 11 mm intermediate density lesion posterior right hepatic lobe. No calcified gallstones or biliary dilatation Pancreas: Unremarkable. No pancreatic ductal dilatation or surrounding inflammatory changes. Spleen: Normal in size without focal abnormality. Adrenals/Urinary Tract: Adrenal glands are within normal limits. No hydronephrosis. 17 mm intermediate density lesion mid pole right kidney. Distended urinary bladder Stomach/Bowel: Stomach nonenlarged. Diffuse fluid-filled small and large bowel. Negative appendix. No colon wall thickening. Vascular/Lymphatic: No significant vascular findings are present. No enlarged abdominal or pelvic lymph nodes. Reproductive: Prostate is unremarkable. Other: Negative for free air. Tiny amount of fluid in the right lower quadrant. Musculoskeletal: Coarse dystrophic appearing calcification anterior to the left pubic bone, could relate to prior trauma or hematoma. No acute or suspicious lesion IMPRESSION: 1. Negative for bowel obstruction, appendicitis, or bowel wall thickening. Scattered fluid-filled loops of small bowel with mild gaseous and fluid enlargement of the colon, query enteritis 2. Tiny amount of free fluid in the right lower  quadrant 3. Indeterminate hypodense lesions in the right hepatic lobe and right kidney. When the patient is clinically stable and able to follow directions and hold their breath (preferably as an outpatient) further evaluation with dedicated abdominal MRI should be considered. Electronically Signed: By: Donavan Foil M.D. On: 04/26/2017 15:50        Scheduled Meds: . chlorhexidine  15 mL Mouth/Throat BID  . enoxaparin (LOVENOX) injection  40 mg Subcutaneous Q24H  . insulin aspart  0-9 Units Subcutaneous Q4H   Continuous Infusions:   LOS: 1 day      Georgette Shell, MD [facility] If 7PM-7AM, please contact night-coverage [email or web address] Password [facility] 04/28/2017, 2:08 PM

## 2017-04-28 NOTE — Interval H&P Note (Signed)
History and Physical Interval Note:  04/28/2017 3:24 PM  Harry Avila  has presented today for surgery, with the diagnosis of Abnormal CT scan, colonic abnormality  The various methods of treatment have been discussed with the patient and family. After consideration of risks, benefits and other options for treatment, the patient has consented to  Procedure(s): FLEXIBLE SIGMOIDOSCOPY (N/A) as a surgical intervention .  The patient's history has been reviewed, patient examined, no change in status, stable for surgery.  I have reviewed the patient's chart and labs.  Questions were answered to the patient's satisfaction.     Inger Wiest

## 2017-04-29 ENCOUNTER — Encounter (HOSPITAL_COMMUNITY)
Admission: EM | Disposition: A | Discharge status: Home / Self Care | Payer: Self-pay | Source: Home / Self Care | Attending: Internal Medicine

## 2017-04-29 ENCOUNTER — Inpatient Hospital Stay (HOSPITAL_COMMUNITY): Payer: BLUE CROSS/BLUE SHIELD | Admitting: Certified Registered Nurse Anesthetist

## 2017-04-29 ENCOUNTER — Encounter (HOSPITAL_COMMUNITY): Payer: Self-pay | Admitting: *Deleted

## 2017-04-29 HISTORY — PX: COLECTOMY WITH COLOSTOMY CREATION/HARTMANN PROCEDURE: SHX6598

## 2017-04-29 LAB — CBC
HEMATOCRIT: 34.3 % — AB (ref 39.0–52.0)
HEMOGLOBIN: 11.3 g/dL — AB (ref 13.0–17.0)
MCH: 31 pg (ref 26.0–34.0)
MCHC: 32.9 g/dL (ref 30.0–36.0)
MCV: 94 fL (ref 78.0–100.0)
Platelets: 354 10*3/uL (ref 150–400)
RBC: 3.65 MIL/uL — AB (ref 4.22–5.81)
RDW: 14 % (ref 11.5–15.5)
WBC: 10.5 10*3/uL (ref 4.0–10.5)

## 2017-04-29 LAB — BASIC METABOLIC PANEL
ANION GAP: 8 (ref 5–15)
BUN: 14 mg/dL (ref 6–20)
CO2: 26 mmol/L (ref 22–32)
Calcium: 8.5 mg/dL — ABNORMAL LOW (ref 8.9–10.3)
Chloride: 104 mmol/L (ref 101–111)
Creatinine, Ser: 1.13 mg/dL (ref 0.61–1.24)
GFR calc Af Amer: >60 mL/min (ref >60)
GLUCOSE: 88 mg/dL (ref 65–99)
POTASSIUM: 3.9 mmol/L (ref 3.5–5.1)
Sodium: 138 mmol/L (ref 135–145)

## 2017-04-29 LAB — GLUCOSE, CAPILLARY
GLUCOSE-CAPILLARY: 78 mg/dL (ref 65–99)
GLUCOSE-CAPILLARY: 115 mg/dL — AB (ref 65–99)
Glucose-Capillary: 90 mg/dL (ref 65–99)
Glucose-Capillary: 82 mg/dL (ref 65–99)

## 2017-04-29 LAB — MRSA PCR SCREENING: MRSA BY PCR: NEGATIVE

## 2017-04-29 LAB — CEA: CEA: 4.1 ng/mL (ref 0.0–4.7)

## 2017-04-29 SURGERY — COLECTOMY, WITH COLOSTOMY CREATION
Anesthesia: General

## 2017-04-29 MED ORDER — FENTANYL CITRATE (PF) 250 MCG/5ML IJ SOLN
INTRAMUSCULAR | Status: AC
  Filled 2017-04-29: qty 5

## 2017-04-29 MED ORDER — PANTOPRAZOLE SODIUM 40 MG IV SOLR
40.0000 mg | Freq: Every day | INTRAVENOUS | Status: DC
  Administered 2017-04-29 – 2017-04-30 (×2): 40 mg via INTRAVENOUS
  Filled 2017-04-29 (×2): qty 40

## 2017-04-29 MED ORDER — METOPROLOL TARTRATE 5 MG/5ML IV SOLN: 5.0000 mg | Freq: Four times a day (QID) | INTRAVENOUS | Status: DC | PRN

## 2017-04-29 MED ORDER — FENTANYL CITRATE (PF) 100 MCG/2ML IJ SOLN
INTRAMUSCULAR | Status: AC
  Filled 2017-04-29: qty 2

## 2017-04-29 MED ORDER — ALBUMIN HUMAN 5 % IV SOLN
INTRAVENOUS | Status: DC | PRN
  Administered 2017-04-29: 14:00:00 via INTRAVENOUS

## 2017-04-29 MED ORDER — ONDANSETRON 4 MG PO TBDP: 4.0000 mg | ORAL_TABLET | Freq: Four times a day (QID) | ORAL | Status: DC | PRN

## 2017-04-29 MED ORDER — POTASSIUM CHLORIDE 2 MEQ/ML IV SOLN
INTRAVENOUS | Status: DC
  Administered 2017-04-29 – 2017-04-30 (×2): via INTRAVENOUS
  Filled 2017-04-29 (×8): qty 1000

## 2017-04-29 MED ORDER — KETOROLAC TROMETHAMINE 30 MG/ML IJ SOLN
30.0000 mg | Freq: Once | INTRAMUSCULAR | Status: DC | PRN
  Administered 2017-04-29: 30 mg via INTRAVENOUS

## 2017-04-29 MED ORDER — ONDANSETRON HCL 4 MG/2ML IJ SOLN
INTRAMUSCULAR | Status: AC
  Filled 2017-04-29: qty 2

## 2017-04-29 MED ORDER — MORPHINE SULFATE (PF) 2 MG/ML IV SOLN
1.0000 mg | INTRAVENOUS | Status: DC | PRN
  Administered 2017-04-29 – 2017-04-30 (×5): 2 mg via INTRAVENOUS
  Administered 2017-04-30: 4 mg via INTRAVENOUS
  Administered 2017-05-01 – 2017-05-02 (×3): 2 mg via INTRAVENOUS
  Filled 2017-04-29 (×4): qty 1
  Filled 2017-04-29: qty 2
  Filled 2017-04-29 (×4): qty 1

## 2017-04-29 MED ORDER — 0.9 % SODIUM CHLORIDE (POUR BTL) OPTIME
TOPICAL | Status: DC | PRN
  Administered 2017-04-29: 3000 mL

## 2017-04-29 MED ORDER — LIDOCAINE 2% (20 MG/ML) 5 ML SYRINGE
INTRAMUSCULAR | Status: DC | PRN
  Administered 2017-04-29: 80 mg via INTRAVENOUS

## 2017-04-29 MED ORDER — SUGAMMADEX SODIUM 200 MG/2ML IV SOLN
INTRAVENOUS | Status: DC | PRN
  Administered 2017-04-29: 200 mg via INTRAVENOUS

## 2017-04-29 MED ORDER — DEXAMETHASONE SODIUM PHOSPHATE 10 MG/ML IJ SOLN
INTRAMUSCULAR | Status: DC | PRN
  Administered 2017-04-29: 10 mg via INTRAVENOUS

## 2017-04-29 MED ORDER — OXYCODONE-ACETAMINOPHEN 5-325 MG PO TABS: 1.0000 | ORAL_TABLET | ORAL | Status: DC | PRN

## 2017-04-29 MED ORDER — LACTATED RINGERS IV SOLN
INTRAVENOUS | Status: DC
  Administered 2017-04-29 (×3): via INTRAVENOUS

## 2017-04-29 MED ORDER — ENOXAPARIN SODIUM 40 MG/0.4ML ~~LOC~~ SOLN: 40.0000 mg | SUBCUTANEOUS | Status: DC

## 2017-04-29 MED ORDER — LACTATED RINGERS IV SOLN: INTRAVENOUS | Status: DC

## 2017-04-29 MED ORDER — DEXTROSE 5 % IV SOLN
2.0000 g | Freq: Two times a day (BID) | INTRAVENOUS | Status: AC
  Administered 2017-04-30: 2 g via INTRAVENOUS
  Filled 2017-04-29: qty 2

## 2017-04-29 MED ORDER — ONDANSETRON HCL 4 MG/2ML IJ SOLN
INTRAMUSCULAR | Status: DC | PRN
  Administered 2017-04-29: 4 mg via INTRAVENOUS

## 2017-04-29 MED ORDER — MIDAZOLAM HCL 2 MG/2ML IJ SOLN
INTRAMUSCULAR | Status: AC
  Filled 2017-04-29: qty 2

## 2017-04-29 MED ORDER — ROCURONIUM BROMIDE 50 MG/5ML IV SOSY
PREFILLED_SYRINGE | INTRAVENOUS | Status: DC | PRN
  Administered 2017-04-29: 50 mg via INTRAVENOUS

## 2017-04-29 MED ORDER — SUCCINYLCHOLINE CHLORIDE 200 MG/10ML IV SOSY
PREFILLED_SYRINGE | INTRAVENOUS | Status: AC
  Filled 2017-04-29: qty 10

## 2017-04-29 MED ORDER — HYDROMORPHONE HCL 1 MG/ML IJ SOLN
INTRAMUSCULAR | Status: AC
  Filled 2017-04-29: qty 2

## 2017-04-29 MED ORDER — LIDOCAINE 2% (20 MG/ML) 5 ML SYRINGE
INTRAMUSCULAR | Status: AC
  Filled 2017-04-29: qty 5

## 2017-04-29 MED ORDER — MEPERIDINE HCL 50 MG/ML IJ SOLN: 6.2500 mg | INTRAMUSCULAR | Status: DC | PRN

## 2017-04-29 MED ORDER — CEFOTETAN DISODIUM-DEXTROSE 2-2.08 GM-%(50ML) IV SOLR
INTRAVENOUS | Status: AC
  Filled 2017-04-29: qty 50

## 2017-04-29 MED ORDER — HYDROMORPHONE HCL 1 MG/ML IJ SOLN
0.2500 mg | INTRAMUSCULAR | Status: DC | PRN
  Administered 2017-04-29 (×2): 0.5 mg via INTRAVENOUS
  Administered 2017-04-29 (×2): 0.25 mg via INTRAVENOUS
  Administered 2017-04-29: 0.5 mg via INTRAVENOUS

## 2017-04-29 MED ORDER — SUGAMMADEX SODIUM 200 MG/2ML IV SOLN
INTRAVENOUS | Status: AC
  Filled 2017-04-29: qty 2

## 2017-04-29 MED ORDER — METHOCARBAMOL 500 MG PO TABS
500.0000 mg | ORAL_TABLET | Freq: Four times a day (QID) | ORAL | Status: DC | PRN
  Administered 2017-04-30: 500 mg via ORAL
  Filled 2017-04-29: qty 1

## 2017-04-29 MED ORDER — MIDAZOLAM HCL 5 MG/5ML IJ SOLN
INTRAMUSCULAR | Status: DC | PRN
  Administered 2017-04-29: 2 mg via INTRAVENOUS

## 2017-04-29 MED ORDER — PROPOFOL 10 MG/ML IV BOLUS
INTRAVENOUS | Status: AC
  Filled 2017-04-29: qty 20

## 2017-04-29 MED ORDER — LIP MEDEX EX OINT: TOPICAL_OINTMENT | CUTANEOUS | Status: DC | PRN

## 2017-04-29 MED ORDER — ROCURONIUM BROMIDE 50 MG/5ML IV SOSY
PREFILLED_SYRINGE | INTRAVENOUS | Status: AC
  Filled 2017-04-29: qty 5

## 2017-04-29 MED ORDER — FENTANYL CITRATE (PF) 100 MCG/2ML IJ SOLN
INTRAMUSCULAR | Status: DC | PRN
  Administered 2017-04-29: 50 ug via INTRAVENOUS
  Administered 2017-04-29: 100 ug via INTRAVENOUS
  Administered 2017-04-29 (×5): 50 ug via INTRAVENOUS

## 2017-04-29 MED ORDER — METOCLOPRAMIDE HCL 5 MG/ML IJ SOLN: 10.0000 mg | Freq: Once | INTRAMUSCULAR | Status: DC | PRN

## 2017-04-29 MED ORDER — KETOROLAC TROMETHAMINE 30 MG/ML IJ SOLN
INTRAMUSCULAR | Status: AC
  Filled 2017-04-29: qty 1

## 2017-04-29 MED ORDER — ONDANSETRON HCL 4 MG/2ML IJ SOLN
4.0000 mg | Freq: Four times a day (QID) | INTRAMUSCULAR | Status: DC | PRN
  Administered 2017-04-29 – 2017-04-30 (×3): 4 mg via INTRAVENOUS
  Filled 2017-04-29 (×3): qty 2

## 2017-04-29 MED ORDER — PROPOFOL 10 MG/ML IV BOLUS
INTRAVENOUS | Status: DC | PRN
  Administered 2017-04-29: 180 mg via INTRAVENOUS

## 2017-04-29 MED ORDER — SUCCINYLCHOLINE CHLORIDE 200 MG/10ML IV SOSY
PREFILLED_SYRINGE | INTRAVENOUS | Status: DC | PRN
  Administered 2017-04-29: 140 mg via INTRAVENOUS

## 2017-04-29 MED ORDER — FENTANYL CITRATE (PF) 100 MCG/2ML IJ SOLN
25.0000 ug | INTRAMUSCULAR | Status: DC | PRN
  Administered 2017-04-29: 25 ug via INTRAVENOUS
  Administered 2017-04-29 (×2): 50 ug via INTRAVENOUS
  Administered 2017-04-29: 25 ug via INTRAVENOUS

## 2017-04-29 SURGICAL SUPPLY — 47 items
APPLICATOR ARISTA FLEXITIP XL (MISCELLANEOUS) ×3 IMPLANT
BLADE EXTENDED COATED 6.5IN (ELECTRODE) ×3 IMPLANT
CHLORAPREP W/TINT 26ML (MISCELLANEOUS) ×3 IMPLANT
COUNTER NEEDLE 20 DBL MAG RED (NEEDLE) ×3 IMPLANT
COVER MAYO STAND STRL (DRAPES) ×9 IMPLANT
COVER SURGICAL LIGHT HANDLE (MISCELLANEOUS) ×3 IMPLANT
DRAPE LAPAROSCOPIC ABDOMINAL (DRAPES) ×3 IMPLANT
DRAPE SURG IRRIG POUCH 19X23 (DRAPES) ×3 IMPLANT
DRAPE WARM FLUID 44X44 (DRAPE) ×3 IMPLANT
DRSG OPSITE POSTOP 4X10 (GAUZE/BANDAGES/DRESSINGS) IMPLANT
DRSG OPSITE POSTOP 4X6 (GAUZE/BANDAGES/DRESSINGS) IMPLANT
DRSG OPSITE POSTOP 4X8 (GAUZE/BANDAGES/DRESSINGS) ×3 IMPLANT
ELECT PENCIL ROCKER SW 15FT (MISCELLANEOUS) ×6 IMPLANT
ELECT REM PT RETURN 15FT ADLT (MISCELLANEOUS) ×3 IMPLANT
GAUZE SPONGE 4X4 12PLY STRL (GAUZE/BANDAGES/DRESSINGS) ×3 IMPLANT
GLOVE EUDERMIC 7 POWDERFREE (GLOVE) ×6 IMPLANT
GOWN STRL REUS W/TWL XL LVL3 (GOWN DISPOSABLE) ×12 IMPLANT
HEMOSTAT ARISTA ABSORB 3G PWDR (MISCELLANEOUS) ×3 IMPLANT
LEGGING LITHOTOMY PAIR STRL (DRAPES) IMPLANT
LIGASURE IMPACT 36 18CM CVD LR (INSTRUMENTS) ×3 IMPLANT
MARKER SKIN DUAL TIP RULER LAB (MISCELLANEOUS) ×3 IMPLANT
PACK COLON (CUSTOM PROCEDURE TRAY) ×3 IMPLANT
PAD POSITIONING PINK XL (MISCELLANEOUS) IMPLANT
SEALER TISSUE X1 CVD JAW (INSTRUMENTS) IMPLANT
SPONGE LAP 18X18 X RAY DECT (DISPOSABLE) ×6 IMPLANT
STAPLER CUT CVD 40MM GREEN (STAPLE) ×3 IMPLANT
STAPLER PROXIMATE 75MM BLUE (STAPLE) ×3 IMPLANT
STAPLER VISISTAT 35W (STAPLE) ×3 IMPLANT
SUT NOV 1 T60/GS (SUTURE) IMPLANT
SUT NOVA NAB DX-16 0-1 5-0 T12 (SUTURE) IMPLANT
SUT NOVA T20/GS 25 (SUTURE) IMPLANT
SUT PDS AB 1 CTX 36 (SUTURE) IMPLANT
SUT PDS AB 1 TP1 96 (SUTURE) ×6 IMPLANT
SUT PROLENE 0 SH 30 (SUTURE) ×3 IMPLANT
SUT PROLENE 2 0 KS (SUTURE) IMPLANT
SUT PROLENE 2 0 SH DA (SUTURE) IMPLANT
SUT SILK 0 SH 30 (SUTURE) IMPLANT
SUT SILK 2 0 (SUTURE) ×2
SUT SILK 2 0 SH CR/8 (SUTURE) ×3 IMPLANT
SUT SILK 2-0 18XBRD TIE 12 (SUTURE) ×1 IMPLANT
SUT SILK 3 0 (SUTURE) ×2
SUT SILK 3 0 SH CR/8 (SUTURE) ×3 IMPLANT
SUT SILK 3-0 18XBRD TIE 12 (SUTURE) ×1 IMPLANT
SUT VIC AB 3-0 SH 8-18 (SUTURE) ×3 IMPLANT
TOWEL OR 17X26 10 PK STRL BLUE (TOWEL DISPOSABLE) ×3 IMPLANT
TOWEL OR NON WOVEN STRL DISP B (DISPOSABLE) ×3 IMPLANT
TRAY FOLEY W/METER SILVER 16FR (SET/KITS/TRAYS/PACK) ×3 IMPLANT

## 2017-04-29 NOTE — Op Note (Signed)
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 Dalbert Batman, M.D., FACS  Assistant:                      Alphonsa Overall, M.D.   Indication for Assistant: Very complex exposure.  High technical level  Operative Indications:   This is a healthy 57 year old man who noticed gradual changes bowel habits 4-5 weeks ago with frequent loose stools.  He presented with a three-day history of progressive increasing crampy midabdominal pain and nausea and headache op's.  Remission he is found to have abdominal distention.  Imaging studies showed high-grade colonic obstruction with a small sigmoid colon mass.  Nasogastric tube was placed and he felt better.  He was given enemas and flexible sigmoidoscopy was performed yesterday which showed a high-grade neoplastic obstructing mass at 30 cm.  Biopsies were taken.  He is brought to the operating room for resection and temporary colostomy  Operative Findings:       There was a 3 cm mass in the distal sigmoid colon that was completely obstructing.  The colon and small bowel proximal to this were significantly distended but there was no perforation or ischemia.  I could see the extensive tattoo markings and some of the [place] 8 used for tattoo was found to the extraluminal area the mesenteric lymph nodes were not enlarged but a few of them contained the [place] ink.  The peritoneal surface and liver felt normal.  Procedure in Detail:          Following the induction of general endotracheal anesthesia the abdomen was prepped and draped in a sterile fashion.  The patient had previously had a Foley catheter and nasogastric tube placed.  Surgical timeout was performed.  Intravenous antibiotics were given.  Midline laparotomy incision was made.  The abdomen was entered and explored with findings as described above.  We milked all the small bowel content and air back into the stomach where it was evacuated from the NG tube.  Self-retaining retractor was placed.  The sigmoid colon was carefully mobilized from lateral to medial.  The left ureter was identified and preserved.  I transected the sigmoid colon several inches above the tumor with a GIA stapling device.  and then I transected the rectosigmoid colon several inches below the tumor,  and I used a contour stapling device with a green load there.  The rectal stump was marked with 2 Prolene sutures.  Mesenteric vessels were controlled with the LigaSure device or 2-0 silk ties.  The proximal margin of the specimen was marked with a silk suture and the specimen was sent to the lab.  Hemostasis was excellent and achieved electrocautery and in the pelvis a little bit of Arista powder.     The left colon and proximal sigmoid colon were mobilized extensively.  I brought the colostomy out in the left rectus she sat at the level of the umbilicus.  A cut out a  circular button of skin.  The fascia was incised in a cruciate fashion.  The muscles were retracted and the posterior rectus sheath incised.  I dilated this opening until it would accommodate 2 fingers.  I was careful not to twist the proximal colon is a brought in through the abdominal wall.     The abdomen was irrigated with saline.  I ran the small bowel one more time.  I found no other evidence of tumor or injury or ischemia.  The omentum was brought down.  The midline fascia was closed with a running suture of #1 double-stranded PDS.  The skin was closed with skin staples.     I amputated the staple line on the colon.  I Matured the colostomy with multiple interrupted sutures of 3-0 Vicryl.  Colostomy bag was placed.  Midline wound dressing was placed.  The patient tolerated the procedure well was taken to PACU  in stable condition.  EBL 100 mL.  Counts correct.  Complications none.     Edsel Petrin. Dalbert Batman, M.D., FACS General and Minimally Invasive Surgery Breast and Colorectal Surgery  04/29/2017 1:52 PM

## 2017-04-29 NOTE — Progress Notes (Signed)
Adenocarcinoma on pathology report.  Plan for patient to go to OR for resection today by Dr Dalbert Batman Will plan for outpatient follow up and full colonoscopy in 6-12 months once he recovers from surgery Available for any questions  K. Denzil Magnuson , MD [phone] Mon-Fri 8a-5p [phone] after 5p, weekends, holidays

## 2017-04-29 NOTE — Progress Notes (Signed)
PROGRESS NOTE    Harry Avila  KTG:[id number] DOB: 05-24-1960 DOA: 04/27/2017 PCP: System, Pcp Not In  Brief Narrative:  57 year old male with no significant past medical history admitted with abdominal pain nausea.  Patient noticed a change in his bowel habits with frequent loose bowels.  He does complain of increasing mid abdominal pain.  He was found to have proximal sigmoid colon circumferential narrowing.  CT scan of the abdomenPersistent circumferential narrowing of the proximal sigmoid colon uspicious for malignancy (as versus non malignant stricture/focal colitis). This is causing high-grade partial obstruction of proximal colon and small bowel. Increase in amount of free fluid within the pelvis.  Foley catheter with decompression of urinary bladder.  Medullary sponge kidneys.  Previously noted indeterminate liver and right renal lesion not as well delineated on the present exam. Please see prior CT report recommendation. Patient seen in consult by surgery.  GI consult placed today 04/28/2017 patient resting in bed with NG tube in place  .  He does complain of abdominal pain.  04/29/2017 patient is scheduled to undergo surgery today.  He is in good spirits today.  Patient had flexible sigmoidoscopy yesterday.  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  Completely obstructing tumor in the distal sigmoid colon was noted.  Patient is originally from [place] he is here for work-related issues.  He has a wife and 2 children who   are not with him at this time.  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  Patient to go for surgery today. 2] acute kidney injury continue IV hydration improving creatinine 1.13 today 3] hyponatremia has been resolved. 4] hypertension  probably related to anxiety will order IV Lopressor.   DVT prophylaxis: Lovenox Code Status: Full code Family Communication: None Disposition Plan: TBD  Consultants:  Surgery, GI Procedures: Flex sig Antimicrobials: None  Subjective: Feels okay   Objective: Vitals:   04/28/17 1620 04/28/17 1658 04/28/17 2305 04/29/17 0532  BP: (!) 156/87 (!) 154/83 (!) 179/78 (!) 160/90  Pulse: 83 82 93 92  Resp: 15 16 16 16   Temp:  98.3 F (36.8 C) 98.8 F (37.1 C) 98.2 F (36.8 C)  TempSrc:  Oral Oral Oral  SpO2: 99% 99% 99% 100%  Weight:      Height:        Intake/Output Summary (Last 24 hours) at 04/29/2017 1257 Last data filed at 04/29/2017 1243 Gross per 24 hour  Intake 2660 ml  Output 2725 ml  Net -65 ml   Filed Weights   04/27/17 1055 04/28/17 0500 04/28/17 1510  Weight: 79.4 kg (175 lb) 83.6 kg (184 lb 4.9 oz) 83.5 kg (184 lb)    Examination:  General exam: Appears calm and comfortable  Respiratory system: Clear to auscultation. Respiratory effort normal. Cardiovascular system: S1 & S2 heard, RRR. No JVD, murmurs, rubs, gallops or clicks. No pedal edema. Gastrointestinal system: Abdomen is nondistended, soft and nontender. No organomegaly or masses felt. Normal bowel sounds heard. Central nervous system: Alert and oriented. No focal neurological deficits. Extremities: Symmetric 5 x 5 power. Skin: No rashes, lesions or ulcers Psychiatry: Judgement and insight appear normal. Mood & affect appropriate.     Data Reviewed: I have personally reviewed following labs and imaging studies  CBC: Recent Labs  Lab 04/26/17 1337 04/27/17  1130 04/28/17 0449 04/28/17 2355  WBC 12.4* 8.9 9.3 10.5  NEUTROABS  --  6.7  --   --   HGB 13.6 13.5 12.1* 11.3*  HCT 40.1 40.6 36.6* 34.3*  MCV 92.0 92.1 94.1 94.0  PLT 424* 427* 417* 160   Basic Metabolic Panel: Recent Labs  Lab 04/26/17 1337 04/27/17 1130 04/28/17 0449 04/29/17 0501  NA 133* 132* 136 138  K 4.2 5.0 4.1 3.9   CL 99* 100* 102 104  CO2 23 27 27 26   GLUCOSE 125* 129* 107* 88  BUN 14 20 18 14   CREATININE 1.23 1.49* 1.36* 1.13  CALCIUM 9.6 9.3 8.7* 8.5*   GFR: Estimated Creatinine Clearance: 77.2 mL/min (by C-G formula based on SCr of 1.13 mg/dL). Liver Function Tests: Recent Labs  Lab 04/26/17 1337 04/27/17 1130 04/28/17 0449  AST 20 19 15   ALT 17 17 13*  ALKPHOS 102 93 76  BILITOT 0.6 0.7 0.5  PROT 8.6* 8.0 6.7  ALBUMIN 4.1 3.7 3.4*   Recent Labs  Lab 04/26/17 1337  LIPASE 23   No results for input(s): AMMONIA in the last 168 hours. Coagulation Profile: No results for input(s): INR, PROTIME in the last 168 hours. Cardiac Enzymes: No results for input(s): CKTOTAL, CKMB, CKMBINDEX, TROPONINI in the last 168 hours. BNP (last 3 results) No results for input(s): PROBNP in the last 8760 hours. HbA1C: Recent Labs    04/28/17 0449  HGBA1C 5.6   CBG: Recent Labs  Lab 04/28/17 2110 04/28/17 2355 04/29/17 0423 04/29/17 0804 04/29/17 0921  GLUCAP 99 90 90 82 78   Lipid Profile: No results for input(s): CHOL, HDL, LDLCALC, TRIG, CHOLHDL, LDLDIRECT in the last 72 hours. Thyroid Function Tests: No results for input(s): TSH, T4TOTAL, FREET4, T3FREE, THYROIDAB in the last 72 hours. Anemia Panel: No results for input(s): VITAMINB12, FOLATE, FERRITIN, TIBC, IRON, RETICCTPCT in the last 72 hours. Sepsis Labs: Recent Labs  Lab 04/27/17 1145 04/27/17 1503  LATICACIDVEN 1.56 1.71    Recent Results (from the past 240 hour(s))  MRSA PCR Screening     Status: None   Collection Time: 04/29/17  8:50 AM  Result Value Ref Range Status   MRSA by PCR NEGATIVE NEGATIVE Final    Comment:        The GeneXpert MRSA Assay (FDA approved for NASAL specimens only), is one component of a comprehensive MRSA colonization surveillance program. It is not intended to diagnose MRSA infection nor to guide or monitor treatment for MRSA infections.          Radiology Studies: Ct  Abdomen Pelvis Wo Contrast  Result Date: 04/27/2017 CLINICAL DATA:  57 year old male with suprapubic pain for 2 days. Subsequent encounter. EXAM: CT ABDOMEN AND PELVIS WITHOUT CONTRAST TECHNIQUE: Multidetector CT imaging of the abdomen and pelvis was performed following the standard protocol without IV contrast. COMPARISON:  04/26/2017 CT. FINDINGS: Lower chest: Basilar subsegmental atelectasis. Heart size within normal limits. Hepatobiliary: 11 mm posterior right lobe of indeterminate hypodense lesion. Vicarious excretion of contrast in the gallbladder. Pancreas: Taking into account limitation by non contrast imaging, no pancreatic mass or inflammation. Spleen: Taking into account limitation by non contrast imaging, no splenic mass or enlargement. Adrenals/Urinary Tract: Dense pyramids bilaterally suggestive of medullary sponge disease without obstructing stone or hydronephrosis. 1.7 cm indeterminate lesion anterior aspect midportion right kidney better delineated on prior CT. Urinary bladder decompressed with Foley catheter. No adrenal mass. Stomach/Bowel: Persistent circumferential narrowing of the proximal sigmoid colon. This does not cause  complete obstruction as gas has traversed beyond this region however, proximal large and small bowel dilated consistent with high-grade partial obstruction. Vascular/Lymphatic: No aortic aneurysm.  No adenopathy. Reproductive: Prostate gland unremarkable. Other: Increase amount of free fluid most prominent pelvic region. No free intraperitoneal air. No bowel containing hernia. Musculoskeletal: Coarse calcifications anterior to left pubic symphysis may be related prior trauma. IMPRESSION: Persistent circumferential narrowing of the proximal sigmoid colon suspicious for malignancy (as versus non malignant stricture/focal colitis). This is causing high-grade partial obstruction of proximal colon and small bowel. Increase in amount of free fluid within the pelvis. Foley  catheter with decompression of urinary bladder. Medullary sponge kidneys. Previously noted indeterminate liver and right renal lesion not as well delineated on the present exam. Please see prior CT report recommendation. These results were called by telephone at the time of interpretation on 04/27/2017 at 3:05 pm to Dr. Theotis Burrow , who verbally acknowledged these results. Electronically Signed   By: Genia Del M.D.   On: 04/27/2017 15:08        Scheduled Meds: . cefoTEtan in Dextrose 5%      . [MAR Hold] chlorhexidine  15 mL Mouth/Throat BID  . Chlorhexidine Gluconate Cloth  6 each Topical Once  . [MAR Hold] insulin aspart  0-9 Units Subcutaneous Q4H   Continuous Infusions: . sodium chloride 150 mL/hr at 04/29/17 0128  . lactated ringers 75 mL/hr at 04/29/17 1583     LOS: 2 days      Georgette Shell, MD [facility]  If 7PM-7AM, please contact night-coverage [email or web address] Password [email or web address] 04/29/2017, 12:57 PM

## 2017-04-29 NOTE — Anesthesia Preprocedure Evaluation (Signed)
Anesthesia Evaluation  Patient identified by MRN, date of birth, ID band Patient awake    Reviewed: Allergy & Precautions, NPO status , Patient's Chart, lab work & pertinent test results  Airway Mallampati: II  TM Distance: >3 FB Neck ROM: Full    Dental no notable dental hx.    Pulmonary neg pulmonary ROS,    Pulmonary exam normal breath sounds clear to auscultation       Cardiovascular negative cardio ROS Normal cardiovascular exam Rhythm:Regular Rate:Normal     Neuro/Psych negative neurological ROS  negative psych ROS   GI/Hepatic negative GI ROS, Neg liver ROS,   Endo/Other  negative endocrine ROS  Renal/GU negative Renal ROS  negative genitourinary   Musculoskeletal negative musculoskeletal ROS (+)   Abdominal   Peds negative pediatric ROS (+)  Hematology negative hematology ROS (+)   Anesthesia Other Findings   Reproductive/Obstetrics negative OB ROS                             Anesthesia Physical Anesthesia Plan  ASA: I  Anesthesia Plan: General   Post-op Pain Management:    Induction: Intravenous  PONV Risk Score and Plan: 3 and Ondansetron, Dexamethasone, Midazolam and Treatment may vary due to age or medical condition  Airway Management Planned: Oral ETT  Additional Equipment:   Intra-op Plan:   Post-operative Plan: Extubation in OR  Informed Consent: I have reviewed the patients History and Physical, chart, labs and discussed the procedure including the risks, benefits and alternatives for the proposed anesthesia with the patient or authorized representative who has indicated his/her understanding and acceptance.     Dental advisory given  Plan Discussed with: CRNA  Anesthesia Plan Comments:         Anesthesia Quick Evaluation  

## 2017-04-29 NOTE — Interval H&P Note (Signed)
History and Physical Interval Note:  04/29/2017 11:39 AM  [place]  has presented today for surgery, with the diagnosis of COLONIC OBSTRUCTION  The various methods of treatment have been discussed with the patient and family. After consideration of risks, benefits and other options for treatment, the patient has consented to  Procedure(s): COLON RESECTION WITH COLOSTOMY (N/A) as a surgical intervention .  The patient's history has been reviewed, patient examined, no change in status, stable for surgery.  I have reviewed the patient's chart and labs.  Questions were answered to the patient's satisfaction.     Harry Avila

## 2017-04-29 NOTE — Transfer of Care (Signed)
Immediate Anesthesia Transfer of Care Note  Patient: Harry Avila  Procedure(s) Performed: SEGMENTAL COLON RESECTION WITH COLOSTOMY (N/A )  Patient Location: PACU  Anesthesia Type:General  Level of Consciousness: drowsy and patient cooperative  Airway & Oxygen Therapy: Patient Spontanous Breathing and Patient connected to face mask oxygen  Post-op Assessment: Report given to RN and Post -op Vital signs reviewed and stable  Post vital signs: Reviewed and stable  Last Vitals:  Vitals:   04/28/17 2305 04/29/17 0532  BP: (!) 179/78 (!) 160/90  Pulse: 93 92  Resp: 16 16  Temp: 37.1 C 36.8 C  SpO2: 99% 100%    Last Pain:  Vitals:   04/29/17 0816  TempSrc:   PainSc: 0-No pain      Patients Stated Pain Goal: 2 (50/51/83 3582)  Complications: No apparent anesthesia complications

## 2017-04-29 NOTE — Anesthesia Procedure Notes (Signed)
Procedure Name: Intubation Date/Time: 04/29/2017 11:59 AM Performed by: Montel Clock, CRNA Pre-anesthesia Checklist: Patient identified, Emergency Drugs available, Suction available, Patient being monitored and Timeout performed Patient Re-evaluated:Patient Re-evaluated prior to induction Oxygen Delivery Method: Circle system utilized Preoxygenation: Pre-oxygenation with 100% oxygen Induction Type: IV induction and Rapid sequence Laryngoscope Size: Mac and 3 Grade View: Grade II Tube type: Oral Tube size: 7.5 mm Number of attempts: 1 Airway Equipment and Method: Stylet Placement Confirmation: ETT inserted through vocal cords under direct vision,  positive ETCO2 and breath sounds checked- equal and bilateral Secured at: 23 cm Tube secured with: Tape Dental Injury: Teeth and Oropharynx as per pre-operative assessment  Comments: Grade 2 view with downward laryngeal pressure.

## 2017-04-29 NOTE — Anesthesia Postprocedure Evaluation (Signed)
Anesthesia Post Note  Patient: Harry Avila  Procedure(s) Performed: SEGMENTAL COLON RESECTION WITH COLOSTOMY (N/A )     Patient location during evaluation: PACU Anesthesia Type: General Level of consciousness: sedated Pain management: pain level controlled Vital Signs Assessment: post-procedure vital signs reviewed and stable Respiratory status: spontaneous breathing and respiratory function stable Cardiovascular status: stable Postop Assessment: no apparent nausea or vomiting Anesthetic complications: no    Last Vitals:  Vitals:   04/29/17 1520 04/29/17 1530  BP:  (!) 164/91  Pulse: 92 97  Resp: 14 14  Temp:  36.8 C  SpO2: 100% 100%    Last Pain:  Vitals:   04/29/17 1530  TempSrc:   PainSc: 3                  Dashley Monts DANIEL

## 2017-04-30 ENCOUNTER — Encounter (HOSPITAL_COMMUNITY): Payer: Self-pay | Admitting: General Surgery

## 2017-04-30 LAB — BASIC METABOLIC PANEL
Anion gap: 5 (ref 5–15)
BUN: 19 mg/dL (ref 6–20)
CHLORIDE: 104 mmol/L (ref 101–111)
CO2: 28 mmol/L (ref 22–32)
CREATININE: 1.27 mg/dL — AB (ref 0.61–1.24)
Calcium: 8.2 mg/dL — ABNORMAL LOW (ref 8.9–10.3)
GFR calc Af Amer: >60 mL/min (ref >60)
GFR calc non Af Amer: >60 mL/min (ref >60)
Glucose, Bld: 124 mg/dL — ABNORMAL HIGH (ref 65–99)
Potassium: 4.7 mmol/L (ref 3.5–5.1)
SODIUM: 137 mmol/L (ref 135–145)

## 2017-04-30 LAB — CBC
HCT: 29.5 % — ABNORMAL LOW (ref 39.0–52.0)
HEMOGLOBIN: 9.8 g/dL — AB (ref 13.0–17.0)
MCH: 30.8 pg (ref 26.0–34.0)
MCHC: 33.2 g/dL (ref 30.0–36.0)
MCV: 92.8 fL (ref 78.0–100.0)
Platelets: 313 10*3/uL (ref 150–400)
RBC: 3.18 MIL/uL — ABNORMAL LOW (ref 4.22–5.81)
RDW: 14 % (ref 11.5–15.5)
WBC: 9.9 10*3/uL (ref 4.0–10.5)

## 2017-04-30 LAB — GLUCOSE, CAPILLARY
GLUCOSE-CAPILLARY: 95 mg/dL (ref 65–99)
Glucose-Capillary: 87 mg/dL (ref 65–99)
Glucose-Capillary: 98 mg/dL (ref 65–99)

## 2017-04-30 MED ORDER — KETOROLAC TROMETHAMINE 30 MG/ML IJ SOLN
30.0000 mg | Freq: Four times a day (QID) | INTRAMUSCULAR | Status: AC | PRN
  Administered 2017-05-01 – 2017-05-02 (×3): 30 mg via INTRAVENOUS
  Filled 2017-04-30 (×3): qty 1

## 2017-04-30 MED ORDER — SODIUM CHLORIDE 0.45 % IV SOLN
INTRAVENOUS | Status: DC
  Administered 2017-04-30: 22:00:00 via INTRAVENOUS
  Administered 2017-04-30: 125 mL/h via INTRAVENOUS

## 2017-04-30 NOTE — Progress Notes (Signed)
PROGRESS NOTE    Harry Avila  JJO:[id number] DOB: 1960/08/21 DOA: 04/27/2017 PCP: System, Pcp Not In  Brief Narrative:57 year old male with no significant past medical history admitted with abdominal pain nausea. Patient noticed a change in his bowel habits with frequent loose bowels. He does complain of increasing mid abdominal pain. He was found to have proximal sigmoid colon circumferential narrowing. CT scan of the abdomenPersistent circumferential narrowing of the proximal sigmoid colon uspicious for malignancy (as versus non malignant stricture/focal colitis). This is causing high-grade partial obstruction of proximal colon and small bowel. Increase in amount of free fluid within the pelvis.  Foley catheter with decompression of urinary bladder.  Medullary sponge kidneys.  Previously noted indeterminate liver and right renal lesion not as well delineated on the present exam. Please see prior CT report recommendation. Patient seen in consult by surgery. GI consult placed today 04/28/2017 patient resting in bed with NG tube in place  . He does complain of abdominal pain.  04/29/2017 patient is scheduled to undergo surgery today.  He is in good spirits today.  Patient had flexible sigmoidoscopy yesterday.  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  Completely obstructing tumor in the distal sigmoid colon was noted.  Patient is originally from [place] he is here for work-related issues.  He has a wife and 2 children who   are not with him at this time.  1/18-patient resting in bed.  Complains of some abdominal pain has gas in the colostomy bag.  Has NG tube still in place.    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  Patient is s/p exploratory laparotomy  sigmoid colectomy and end colostomy 04/29/2017-plan per surgery.   2] acute kidney injury continue IV hydration. creatinine 1.27 today 3] hyponatremia has been resolved. 4] hypertension probably related to anxiety will order IV Lopressor.       DVT prophylaxis:lovenox Code Status: full Family Communication:no family available Disposition Plan:  tbd Consultants: surg  Procedures: Exploratory laparotomy, sigmoid colectomy, colostomy Antimicrobials: None Subjective: Feels okay does have some abdominal pain  Objective: Vitals:   04/30/17 0024 04/30/17 0438 04/30/17 0500 04/30/17 1317  BP: (!) 137/94  (!) 148/78 (!) 161/92  Pulse: 91  88 82  Resp: 16  18 18   Temp: 99 F (37.2 C)  98.8 F (37.1 C) 98.6 F (37 C)  TempSrc: Oral  Oral Oral  SpO2: 98%  99% 99%  Weight:  82.9 kg (182 lb 12.2 oz)    Height:        Intake/Output Summary (Last 24 hours) at 04/30/2017 1336 Last data filed at 04/30/2017 1022 Gross per 24 hour  Intake 2889.58 ml  Output 1930 ml  Net 959.58 ml   Filed Weights   04/28/17 0500 04/28/17 1510 04/30/17 0438  Weight: 83.6 kg (184 lb 4.9 oz) 83.5 kg (184 lb) 82.9 kg (182 lb 12.2 oz)    Examination:  General exam: Appears calm and comfortable  Respiratory system: Clear to auscultation. Respiratory effort normal. Cardiovascular system: S1 & S2 heard, RRR. No JVD, murmurs, rubs, gallops or clicks. No pedal edema. Gastrointestinal system: Abdomen is nondistended, soft and mild tender. Colostomy in place.No organomegaly or masses felt. Normal bowel sounds heard. Central nervous system: Alert and oriented. No focal neurological deficits. Extremities: Symmetric 5  x 5 power. Skin: No rashes, lesions or ulcers Psychiatry: Judgement and insight appear normal. Mood & affect appropriate.     Data Reviewed: I have personally reviewed following labs and imaging studies  CBC: Recent Labs  Lab 04/26/17 1337 04/27/17 1130 04/28/17 0449 04/28/17 2355  04/30/17 0451  WBC 12.4* 8.9 9.3 10.5 9.9  NEUTROABS  --  6.7  --   --   --   HGB 13.6 13.5 12.1* 11.3* 9.8*  HCT 40.1 40.6 36.6* 34.3* 29.5*  MCV 92.0 92.1 94.1 94.0 92.8  PLT 424* 427* 417* 354 867   Basic Metabolic Panel: Recent Labs  Lab 04/26/17 1337 04/27/17 1130 04/28/17 0449 04/29/17 0501 04/30/17 0451  NA 133* 132* 136 138 137  K 4.2 5.0 4.1 3.9 4.7  CL 99* 100* 102 104 104  CO2 23 27 27 26 28   GLUCOSE 125* 129* 107* 88 124*  BUN 14 20 18 14 19   CREATININE 1.23 1.49* 1.36* 1.13 1.27*  CALCIUM 9.6 9.3 8.7* 8.5* 8.2*   GFR: Estimated Creatinine Clearance: 68.7 mL/min (A) (by C-G formula based on SCr of 1.27 mg/dL (H)). Liver Function Tests: Recent Labs  Lab 04/26/17 1337 04/27/17 1130 04/28/17 0449  AST 20 19 15   ALT 17 17 13*  ALKPHOS 102 93 76  BILITOT 0.6 0.7 0.5  PROT 8.6* 8.0 6.7  ALBUMIN 4.1 3.7 3.4*   Recent Labs  Lab 04/26/17 1337  LIPASE 23   No results for input(s): AMMONIA in the last 168 hours. Coagulation Profile: No results for input(s): INR, PROTIME in the last 168 hours. Cardiac Enzymes: No results for input(s): CKTOTAL, CKMB, CKMBINDEX, TROPONINI in the last 168 hours. BNP (last 3 results) No results for input(s): PROBNP in the last 8760 hours. HbA1C: Recent Labs    04/28/17 0449  HGBA1C 5.6   CBG: Recent Labs  Lab 04/28/17 2355 04/29/17 0423 04/29/17 0804 04/29/17 0921 04/29/17 1302  GLUCAP 90 90 82 78 115*   Lipid Profile: No results for input(s): CHOL, HDL, LDLCALC, TRIG, CHOLHDL, LDLDIRECT in the last 72 hours. Thyroid Function Tests: No results for input(s): TSH, T4TOTAL, FREET4, T3FREE, THYROIDAB in the last 72 hours. Anemia Panel: No results for input(s): VITAMINB12, FOLATE, FERRITIN, TIBC, IRON, RETICCTPCT in the last 72 hours. Sepsis Labs: Recent Labs  Lab 04/27/17 1145 04/27/17 1503  LATICACIDVEN 1.56 1.71    Recent Results (from the past 240 hour(s))  MRSA PCR Screening     Status: None    Collection Time: 04/29/17  8:50 AM  Result Value Ref Range Status   MRSA by PCR NEGATIVE NEGATIVE Final    Comment:        The GeneXpert MRSA Assay (FDA approved for NASAL specimens only), is one component of a comprehensive MRSA colonization surveillance program. It is not intended to diagnose MRSA infection nor to guide or monitor treatment for MRSA infections.          Radiology Studies: No results found.      Scheduled Meds: . chlorhexidine  15 mL Mouth/Throat BID  . enoxaparin (LOVENOX) injection  40 mg Subcutaneous Q24H  . insulin aspart  0-9 Units Subcutaneous Q4H  . pantoprazole (PROTONIX) IV  40 mg Intravenous QHS   Continuous Infusions: . lactated ringers with kcl 125 mL/hr at 04/30/17 0435     LOS: 3 days     Georgette Shell, MD [facility] 7aM-7AM, please contact night-coverage [email or web address] Password [email or web address] 04/30/2017, 1:36 PM

## 2017-04-30 NOTE — Progress Notes (Signed)
[facility] Progress Note  1 Day Post-Op  Subjective: CC-  Patient states that he is doing well this morning. No complaints. Denies n/v. No flatus or BM. Pain well controlled.  Objective: Vital signs in last 24 hours: Temp:  [97.8 F (36.6 C)-99 F (37.2 C)] 98.8 F (37.1 C) (01/18 0500) Pulse Rate:  [86-101] 88 (01/18 0500) Resp:  [10-19] 18 (01/18 0500) BP: (137-180)/(64-109) 148/78 (01/18 0500) SpO2:  [96 %-100 %] 99 % (01/18 0500) Weight:  [182 lb 12.2 oz (82.9 kg)] 182 lb 12.2 oz (82.9 kg) (01/18 0438) Last BM Date: 04/25/17  Intake/Output from previous day: 01/17 0701 - 01/18 0700 In: 3889.6 [I.V.:3639.6; IV Piggyback:250] Out: 7616 [Urine:2725; Emesis/NG output:60; Stool:20; Blood:100] Intake/Output this shift: No intake/output data recorded.  PE: Gen:  Alert, NAD, pleasant HEENT: EOM's intact, pupils equal and round Card:  RRR, no M/G/R heard Pulm:  CTAB, no W/R/R, effort normal Abd: Soft, mild distension, hypoactive BS, midline incision C/D/I with honeycomb dressing in place, LLQ ostomy pink with air no stool in bag Psych: A&Ox3  Skin: no rashes noted, warm and dry  Lab Results:  Recent Labs    04/28/17 2355 04/30/17 0451  WBC 10.5 9.9  HGB 11.3* 9.8*  HCT 34.3* 29.5*  PLT 354 313   BMET Recent Labs    04/29/17 0501 04/30/17 0451  NA 138 137  K 3.9 4.7  CL 104 104  CO2 26 28  GLUCOSE 88 124*  BUN 14 19  CREATININE 1.13 1.27*  CALCIUM 8.5* 8.2*   PT/INR No results for input(s): LABPROT, INR in the last 72 hours. CMP     Component Value Date/Time   NA 137 04/30/2017 0451   K 4.7 04/30/2017 0451   CL 104 04/30/2017 0451   CO2 28 04/30/2017 0451   GLUCOSE 124 (H) 04/30/2017 0451   BUN 19 04/30/2017 0451   CREATININE 1.27 (H) 04/30/2017 0451   CALCIUM 8.2 (L) 04/30/2017 0451   PROT 6.7 04/28/2017 0449   ALBUMIN 3.4 (L) 04/28/2017 0449   AST 15 04/28/2017 0449   ALT 13 (L) 04/28/2017 0449   ALKPHOS 76 04/28/2017 0449   BILITOT 0.5 04/28/2017 0449   GFRNONAA >60 04/30/2017 0451   GFRAA >60 04/30/2017 0451   Lipase     Component Value Date/Time   LIPASE 23 04/26/2017 1337       Studies/Results: No results found.  Anti-infectives: Anti-infectives (From admission, onward)   Start     Dose/Rate Route Frequency Ordered Stop   04/30/17 0000  cefoTEtan (CEFOTAN) 2 g in dextrose 5 % 50 mL IVPB     2 g 100 mL/hr over 30 Minutes Intravenous Every 12 hours 04/29/17 1555 04/30/17 0058   04/29/17 1315  cefoTEtan (CEFOTAN) 2 g in dextrose 5 % 50 mL IVPB     2 g 100 mL/hr over 30 Minutes Intravenous On call to O.R. 04/28/17 1942 04/29/17 1212   04/29/17 0916  cefoTEtan in Dextrose 5% (CEFOTAN) 2-2.08 GM-%(50ML) IVPB    Comments:  Bridget Hartshorn   : cabinet override      04/29/17 0916 04/29/17 2129   04/29/17 0600  cefoTEtan (CEFOTAN) 2 g in dextrose 5 % 50 mL IVPB  Status:  Discontinued     2 g 100 mL/hr over 30 Minutes Intravenous On call to O.R. 04/28/17 1902 04/28/17 1942       Assessment/Plan AKI - Cr 1.27, good UOP, continue IVF HTN - hydralazine PRN  Complete obstruction of  sigmoid colon secondary to malignancy S/p exploratory laparotomy, sigmoid colectomy, end colostomy 1/17 Dr. Rae Mar - POD 1 - path pending - NGT 60cc/24 hours - no flatus or BM - Continue NPO and NG tube. D/c foley. Encourage patient to ambulate today (ok to clamp NG tube for ambulation). Continue IS. High likelihood of having a prolonged ileus.  ID - cefotetan perioperative FEN - IVF, NPO/NGT VTE - SCDs, lovenox Foley - d/c today Follow up - Dr. Dalbert Batman   LOS: 3 days    Wellington Hampshire , [facility] 04/30/2017, 8:01 AM Pager: [phone] Consults: [phone] Mon-Fri 7:00 am-4:30 pm Sat-Sun 7:00 am-11:30 am

## 2017-05-01 LAB — BASIC METABOLIC PANEL
ANION GAP: 8 (ref 5–15)
BUN: 16 mg/dL (ref 6–20)
CO2: 27 mmol/L (ref 22–32)
Calcium: 8.7 mg/dL — ABNORMAL LOW (ref 8.9–10.3)
Chloride: 99 mmol/L — ABNORMAL LOW (ref 101–111)
Creatinine, Ser: 1.16 mg/dL (ref 0.61–1.24)
GFR calc Af Amer: >60 mL/min (ref >60)
GFR calc non Af Amer: >60 mL/min (ref >60)
GLUCOSE: 117 mg/dL — AB (ref 65–99)
POTASSIUM: 3.8 mmol/L (ref 3.5–5.1)
Sodium: 134 mmol/L — ABNORMAL LOW (ref 135–145)

## 2017-05-01 LAB — CBC
HEMATOCRIT: 29.8 % — AB (ref 39.0–52.0)
Hemoglobin: 9.7 g/dL — ABNORMAL LOW (ref 13.0–17.0)
MCH: 30.6 pg (ref 26.0–34.0)
MCHC: 32.6 g/dL (ref 30.0–36.0)
MCV: 94 fL (ref 78.0–100.0)
Platelets: 320 10*3/uL (ref 150–400)
RBC: 3.17 MIL/uL — AB (ref 4.22–5.81)
RDW: 14.4 % (ref 11.5–15.5)
WBC: 11.1 10*3/uL — ABNORMAL HIGH (ref 4.0–10.5)

## 2017-05-01 LAB — GLUCOSE, CAPILLARY
Glucose-Capillary: 92 mg/dL (ref 65–99)
Glucose-Capillary: 105 mg/dL — ABNORMAL HIGH (ref 65–99)

## 2017-05-01 MED ORDER — KCL IN DEXTROSE-NACL 20-5-0.45 MEQ/L-%-% IV SOLN
INTRAVENOUS | Status: DC
  Administered 2017-05-01 – 2017-05-02 (×3): via INTRAVENOUS
  Filled 2017-05-01 (×4): qty 1000

## 2017-05-01 MED ORDER — HYDRALAZINE HCL 20 MG/ML IJ SOLN
10.0000 mg | Freq: Four times a day (QID) | INTRAMUSCULAR | Status: DC | PRN
  Filled 2017-05-01: qty 1

## 2017-05-01 MED ORDER — ENOXAPARIN SODIUM 40 MG/0.4ML ~~LOC~~ SOLN
40.0000 mg | SUBCUTANEOUS | Status: DC
  Administered 2017-05-01 – 2017-05-05 (×5): 40 mg via SUBCUTANEOUS
  Filled 2017-05-01 (×5): qty 0.4

## 2017-05-01 NOTE — Progress Notes (Signed)
[name]:  [phone] General Surgery Progress Note   LOS: 4 days  POD -  2 Days Post-Op  Chief Complaint: Colonic obstruction  Assessment and Plan: 1.   SEGMENTAL COLON RESECTION WITH COLOSTOMY - 04/29/2017 Harry Avila  For obstructing colon cancer - path pending  Some air in colostomy bag  Will d/c ngt and keep npo  Increase ambulation  2.  DVT prophylaxis - Lovenox stopped, will restart   Principal Problem:   Colonic stricture (HCC) Active Problems:   SBO (small bowel obstruction) (HCC)   Hyponatremia   Renal insufficiency   Colonic mass  Subjective:  Alert, doing okay.  Does not like NGT.  Objective:   Vitals:   04/30/17 2351 05/01/17 0611  BP: (!) 161/81 (!) 167/95  Pulse: (!) 102 93  Resp: 17 16  Temp: 98.7 F (37.1 C) 98.1 F (36.7 C)  SpO2: 98% 97%     Intake/Output from previous day:  01/18 0701 - 01/19 0700 In: 1481.3 [I.V.:1481.3] Out: 2610 [Urine:2550; Emesis/NG output:60]  Intake/Output this shift:  No intake/output data recorded.   Physical Exam:   General: WN AA M who is alert and oriented.    HEENT: Normal. Pupils equal. .   Lungs: Clear   Abdomen: Mild distention, quiet.   Wound: Covered.   Lab Results:    Recent Labs    04/28/17 2355 04/30/17 0451  WBC 10.5 9.9  HGB 11.3* 9.8*  HCT 34.3* 29.5*  PLT 354 313    BMET   Recent Labs    04/29/17 0501 04/30/17 0451  NA 138 137  K 3.9 4.7  CL 104 104  CO2 26 28  GLUCOSE 88 124*  BUN 14 19  CREATININE 1.13 1.27*  CALCIUM 8.5* 8.2*    PT/INR  No results for input(s): LABPROT, INR in the last 72 hours.  ABG  No results for input(s): PHART, HCO3 in the last 72 hours.  Invalid input(s): PCO2, PO2   Studies/Results:  No results found.   Anti-infectives:   Anti-infectives (From admission, onward)   Start     Dose/Rate Route Frequency Ordered Stop   04/30/17 0000  cefoTEtan (CEFOTAN) 2 g in dextrose 5 % 50 mL IVPB     2 g 100 mL/hr over 30  Minutes Intravenous Every 12 hours 04/29/17 1555 04/30/17 0058   04/29/17 1315  cefoTEtan (CEFOTAN) 2 g in dextrose 5 % 50 mL IVPB     2 g 100 mL/hr over 30 Minutes Intravenous On call to O.R. 04/28/17 1942 04/29/17 1212   04/29/17 0916  cefoTEtan in Dextrose 5% (CEFOTAN) 2-2.08 GM-%(50ML) IVPB    Comments:  Bridget Hartshorn   : cabinet override      04/29/17 0916 04/29/17 2129   04/29/17 0600  cefoTEtan (CEFOTAN) 2 g in dextrose 5 % 50 mL IVPB  Status:  Discontinued     2 g 100 mL/hr over 30 Minutes Intravenous On call to O.R. 04/28/17 1902 04/28/17 1942      Alphonsa Overall, MD, FACS Pager: [facility] Office: [phone] 05/01/2017

## 2017-05-01 NOTE — Progress Notes (Signed)
PROGRESS NOTE    HICKS FEICK  OVF:[id number] DOB: Sep 03, 1960 DOA: 04/27/2017 PCP: System, Pcp Not In  Brief Narrative:57 year old male with no significant past medical history admitted with abdominal pain nausea. Patient noticed a change in his bowel habits with frequent loose bowels. He does complain of increasing mid abdominal pain. He was found to have proximal sigmoid colon circumferential narrowing. CT scan of the abdomenPersistent circumferential narrowing of the proximal sigmoid colon uspicious for malignancy (as versus non malignant stricture/focal colitis). This is causing high-grade partial obstruction of proximal colon and small bowel. Increase in amount of free fluid within the pelvis.  Foley catheter with decompression of urinary bladder.  Medullary sponge kidneys.  Previously noted indeterminate liver and right renal lesion not as well delineated on the present exam. Please see prior CT report recommendation. Patient seen in consult by surgery. GI consult placed today 04/28/2017 patient resting in bed with NG tube in place  . He does complain of abdominal pain.  04/29/2017 patient is scheduled to undergo surgery today. He is in good spirits today. Patient had flexible sigmoidoscopy yesterday. 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 Completely obstructing tumor in the distal sigmoid colon was noted. Patient is originally from [place] he is here for work-related issues. He has a wife and 2 children who are not with him at this time.  1/18-patient resting in bed.  Complains of some abdominal pain has gas in the colostomy bag.  Has NG tube still in place.  1/19-patient sitting up in chair.  NG tube has been DC'd yesterday.  Foley has been DC'd yesterday.  Patient reports no nausea vomiting.  Does have some gas in the colostomy bag.  He has been on ice chips and IV fluids.  Assessment & Plan:   Principal Problem:   Colonic stricture (HCC) Active  Problems:   SBO (small bowel obstruction) (HCC)   Hyponatremia   Renal insufficiency   Colonic mass   1]obstructing tumor of distal sigmoid colon.Patient is s/p exploratory laparotomy sigmoid colectomy and end colostomy 04/29/2017-plan per surgery.   2]acute kidney injury continue IV hydration. creatinine down  today 3]hyponatremia stable. 4]hypertension probably related to anxiety will order IV Lopressor.  Continue as needed hydralazine.       DVT prophylaxis: Lovenox has been restarted by surgery today. Code Status: Full code Family Communication: No family available Disposition Plan:  per surgery.  Consultants:  Surgery Procedures: Exploratory laparotomy, sigmoid colectomy, colostomy. Antimicrobials: None Subjective: No specific complaints.  Objective: Vitals:   04/30/17 2101 04/30/17 2245 04/30/17 2351 05/01/17 0611  BP: (!) 171/98  (!) 161/81 (!) 167/95  Pulse: 82  (!) 102 93  Resp: 17  17 16   Temp: 100.2 F (37.9 C) 98.9 F (37.2 C) 98.7 F (37.1 C) 98.1 F (36.7 C)  TempSrc: Oral Oral Oral Oral  SpO2: 98%  98% 97%  Weight:      Height:        Intake/Output Summary (Last 24 hours) at 05/01/2017 1107 Last data filed at 05/01/2017 1007 Gross per 24 hour  Intake 1481.25 ml  Output 2613 ml  Net -1131.75 ml   Filed Weights   04/28/17 0500 04/28/17 1510 04/30/17 0438  Weight: 83.6 kg (184 lb 4.9 oz) 83.5 kg (184 lb) 82.9 kg (182 lb 12.2 oz)    Examination:  General exam: Appears calm and comfortable  Respiratory system: Clear to auscultation. Respiratory effort normal. Cardiovascular system: S1 & S2 heard, RRR. No JVD, murmurs, rubs, gallops or clicks.  No pedal edema. Gastrointestinal system: Abdomen is nondistended, soft and nontender. No organomegaly or masses felt.  bowel sounds heard.COLOSTOMY in place. Central nervous system: Alert and oriented. No focal neurological deficits. Extremities: Symmetric 5 x 5 power. Skin: No rashes, lesions or  ulcers Psychiatry: Judgement and insight appear normal. Mood & affect appropriate.     Data Reviewed: I have personally reviewed following labs and imaging studies  CBC: Recent Labs  Lab 04/27/17 1130 04/28/17 0449 04/28/17 2355 04/30/17 0451 05/01/17 0856  WBC 8.9 9.3 10.5 9.9 11.1*  NEUTROABS 6.7  --   --   --   --   HGB 13.5 12.1* 11.3* 9.8* 9.7*  HCT 40.6 36.6* 34.3* 29.5* 29.8*  MCV 92.1 94.1 94.0 92.8 94.0  PLT 427* 417* 354 313 314   Basic Metabolic Panel: Recent Labs  Lab 04/27/17 1130 04/28/17 0449 04/29/17 0501 04/30/17 0451 05/01/17 0856  NA 132* 136 138 137 134*  K 5.0 4.1 3.9 4.7 3.8  CL 100* 102 104 104 99*  CO2 27 27 26 28 27   GLUCOSE 129* 107* 88 124* 117*  BUN 20 18 14 19 16   CREATININE 1.49* 1.36* 1.13 1.27* 1.16  CALCIUM 9.3 8.7* 8.5* 8.2* 8.7*   GFR: Estimated Creatinine Clearance: 75.2 mL/min (by C-G formula based on SCr of 1.16 mg/dL). Liver Function Tests: Recent Labs  Lab 04/26/17 1337 04/27/17 1130 04/28/17 0449  AST 20 19 15   ALT 17 17 13*  ALKPHOS 102 93 76  BILITOT 0.6 0.7 0.5  PROT 8.6* 8.0 6.7  ALBUMIN 4.1 3.7 3.4*   Recent Labs  Lab 04/26/17 1337  LIPASE 23   No results for input(s): AMMONIA in the last 168 hours. Coagulation Profile: No results for input(s): INR, PROTIME in the last 168 hours. Cardiac Enzymes: No results for input(s): CKTOTAL, CKMB, CKMBINDEX, TROPONINI in the last 168 hours. BNP (last 3 results) No results for input(s): PROBNP in the last 8760 hours. HbA1C: No results for input(s): HGBA1C in the last 72 hours. CBG: Recent Labs  Lab 04/30/17 1728 04/30/17 1958 04/30/17 2350 05/01/17 0359 05/01/17 0801  GLUCAP 87 98 95 92 105*   Lipid Profile: No results for input(s): CHOL, HDL, LDLCALC, TRIG, CHOLHDL, LDLDIRECT in the last 72 hours. Thyroid Function Tests: No results for input(s): TSH, T4TOTAL, FREET4, T3FREE, THYROIDAB in the last 72 hours. Anemia Panel: No results for input(s):  VITAMINB12, FOLATE, FERRITIN, TIBC, IRON, RETICCTPCT in the last 72 hours. Sepsis Labs: Recent Labs  Lab 04/27/17 1145 04/27/17 1503  LATICACIDVEN 1.56 1.71    Recent Results (from the past 240 hour(s))  MRSA PCR Screening     Status: None   Collection Time: 04/29/17  8:50 AM  Result Value Ref Range Status   MRSA by PCR NEGATIVE NEGATIVE Final    Comment:        The GeneXpert MRSA Assay (FDA approved for NASAL specimens only), is one component of a comprehensive MRSA colonization surveillance program. It is not intended to diagnose MRSA infection nor to guide or monitor treatment for MRSA infections.          Radiology Studies: No results found.      Scheduled Meds: . chlorhexidine  15 mL Mouth/Throat BID  . enoxaparin (LOVENOX) injection  40 mg Subcutaneous Q24H   Continuous Infusions: . dextrose 5 % and 0.45 % NaCl with KCl 20 mEq/L 125 mL/hr at 05/01/17 0758     LOS: 4 days      Noland Fordyce  Rodena Piety, MD [facility] If 7PM-7AM, please contact night-coverage [email or web address] Password [email or web address] 05/01/2017, 11:07 AM

## 2017-05-02 DIAGNOSIS — K56609 Unspecified intestinal obstruction, unspecified as to partial versus complete obstruction: Secondary | ICD-10-CM

## 2017-05-02 DIAGNOSIS — Z933 Colostomy status: Secondary | ICD-10-CM

## 2017-05-02 LAB — MAGNESIUM: Magnesium: 2.1 mg/dL (ref 1.7–2.4)

## 2017-05-02 LAB — BASIC METABOLIC PANEL
Anion gap: 5 (ref 5–15)
BUN: 15 mg/dL (ref 6–20)
CHLORIDE: 104 mmol/L (ref 101–111)
CO2: 26 mmol/L (ref 22–32)
CREATININE: 1.07 mg/dL (ref 0.61–1.24)
Calcium: 8.3 mg/dL — ABNORMAL LOW (ref 8.9–10.3)
GFR calc Af Amer: >60 mL/min (ref >60)
GFR calc non Af Amer: >60 mL/min (ref >60)
GLUCOSE: 125 mg/dL — AB (ref 65–99)
Potassium: 3.8 mmol/L (ref 3.5–5.1)
Sodium: 135 mmol/L (ref 135–145)

## 2017-05-02 MED ORDER — SODIUM CHLORIDE 0.9% FLUSH: 3.0000 mL | INTRAVENOUS | Status: DC | PRN

## 2017-05-02 MED ORDER — LACTATED RINGERS IV BOLUS (SEPSIS): 1000.0000 mL | Freq: Three times a day (TID) | INTRAVENOUS | Status: AC | PRN

## 2017-05-02 MED ORDER — ACETAMINOPHEN 500 MG PO TABS
1000.0000 mg | ORAL_TABLET | Freq: Three times a day (TID) | ORAL | Status: DC
  Administered 2017-05-02 – 2017-05-03 (×4): 1000 mg via ORAL
  Filled 2017-05-02 (×6): qty 2

## 2017-05-02 MED ORDER — SODIUM CHLORIDE 0.9% FLUSH
3.0000 mL | Freq: Two times a day (BID) | INTRAVENOUS | Status: DC
  Administered 2017-05-02 – 2017-05-05 (×6): 3 mL via INTRAVENOUS

## 2017-05-02 MED ORDER — OXYCODONE HCL 5 MG PO TABS: 5.0000 mg | ORAL_TABLET | ORAL | Status: DC | PRN

## 2017-05-02 MED ORDER — SODIUM CHLORIDE 0.9 % IV SOLN: 250.0000 mL | INTRAVENOUS | Status: DC | PRN

## 2017-05-02 MED ORDER — METHOCARBAMOL 500 MG PO TABS: 1000.0000 mg | ORAL_TABLET | Freq: Four times a day (QID) | ORAL | Status: DC | PRN

## 2017-05-02 MED ORDER — METHOCARBAMOL 1000 MG/10ML IJ SOLN
1000.0000 mg | Freq: Four times a day (QID) | INTRAVENOUS | Status: DC | PRN
  Filled 2017-05-02: qty 10

## 2017-05-02 NOTE — Progress Notes (Signed)
[facility] Progress Note  3 Days Post-Op  Subjective: CC: no complaints Patient doing well with ambulation and pain control. Having gas in colostomy bag. No stool output yet. Denies nausea/vomiting. Patient very positive and upbeat. UOP good. VSS.   Objective: Vital signs in last 24 hours: Temp:  [98.8 F (37.1 C)-99.3 F (37.4 C)] 99.3 F (37.4 C) (01/20 0530) Pulse Rate:  [76-87] 76 (01/20 0530) Resp:  [16-18] 18 (01/20 0530) BP: (139-154)/(88-92) 149/92 (01/20 0530) SpO2:  [97 %-98 %] 97 % (01/20 0530) Last BM Date: 04/25/17  Intake/Output from previous day: 01/19 0701 - 01/20 0700 In: 1254.2 [I.V.:1254.2] Out: 0  Intake/Output this shift: Total I/O In: 2008.3 [I.V.:2008.3] Out: -   PE: Gen:  Alert, NAD, pleasant Card:  Regular rate and rhythm, pedal pulses 2+ BL Pulm:  Normal effort, clear to auscultation bilaterally Abd: Soft, non-tender, non-distended, bowel sounds present, no HSM, incisions C/D/I; stoma pink with serosanguinous drainage and gas present  Skin: warm and dry, no rashes  Psych: A&Ox3   Lab Results:  Recent Labs    04/30/17 0451 05/01/17 0856  WBC 9.9 11.1*  HGB 9.8* 9.7*  HCT 29.5* 29.8*  PLT 313 320   BMET Recent Labs    05/01/17 0856 05/02/17 0413  NA 134* 135  K 3.8 3.8  CL 99* 104  CO2 27 26  GLUCOSE 117* 125*  BUN 16 15  CREATININE 1.16 1.07  CALCIUM 8.7* 8.3*   PT/INR No results for input(s): LABPROT, INR in the last 72 hours. CMP     Component Value Date/Time   NA 135 05/02/2017 0413   K 3.8 05/02/2017 0413   CL 104 05/02/2017 0413   CO2 26 05/02/2017 0413   GLUCOSE 125 (H) 05/02/2017 0413   BUN 15 05/02/2017 0413   CREATININE 1.07 05/02/2017 0413   CALCIUM 8.3 (L) 05/02/2017 0413   PROT 6.7 04/28/2017 0449   ALBUMIN 3.4 (L) 04/28/2017 0449   AST 15 04/28/2017 0449   ALT 13 (L) 04/28/2017 0449   ALKPHOS 76 04/28/2017 0449   BILITOT 0.5 04/28/2017 0449   GFRNONAA >60 05/02/2017 0413   GFRAA >60  05/02/2017 0413   Lipase     Component Value Date/Time   LIPASE 23 04/26/2017 1337       Studies/Results: No results found.  Anti-infectives: Anti-infectives (From admission, onward)   Start     Dose/Rate Route Frequency Ordered Stop   04/30/17 0000  cefoTEtan (CEFOTAN) 2 g in dextrose 5 % 50 mL IVPB     2 g 100 mL/hr over 30 Minutes Intravenous Every 12 hours 04/29/17 1555 04/30/17 0058   04/29/17 1315  cefoTEtan (CEFOTAN) 2 g in dextrose 5 % 50 mL IVPB     2 g 100 mL/hr over 30 Minutes Intravenous On call to O.R. 04/28/17 1942 04/29/17 1212   04/29/17 0916  cefoTEtan in Dextrose 5% (CEFOTAN) 2-2.08 GM-%(50ML) IVPB    Comments:  Bridget Hartshorn   : cabinet override      04/29/17 0916 04/29/17 2129   04/29/17 0600  cefoTEtan (CEFOTAN) 2 g in dextrose 5 % 50 mL IVPB  Status:  Discontinued     2 g 100 mL/hr over 30 Minutes Intravenous On call to O.R. 04/28/17 1902 04/28/17 1942       Assessment/Plan Principal Problem:   Colonic stricture ([place]) Active Problems:   SBO (small bowel obstruction) (HCC)   Hyponatremia   Renal insufficiency   Colonic mass   1.  SEGMENTAL COLON RESECTION WITH COLOSTOMY - 04/29/2017 Dalbert Batman             For obstructing colon cancer - path pending             Some air in colostomy bag             advance diet to CLD              continue ambulation  FEN: CLD, IVF VTE: SCDs, lovenox ID: [email or web address] periop   LOS: 5 days    Brigid Re , [facility] 05/02/2017, 11:13 AM Pager: [phone] Mon-Fri 7:00 am-4:30 pm Sat-Sun 7:00 am-11:30 am

## 2017-05-02 NOTE — Progress Notes (Signed)
PROGRESS NOTE    Harry Avila  ZOX:[id number] DOB: [birth year] DOA: 04/27/2017 PCP: System, Pcp Not In  Brief Narrative: 57 year old male with no significant past medical history admitted with abdominal pain nausea. Patient noticed a change in his bowel habits with frequent loose bowels. He does complain of increasing mid abdominal pain. He was found to have proximal sigmoid colon circumferential narrowing. CT scan of the abdomenPersistent circumferential narrowing of the proximal sigmoid colon uspicious for malignancy (as versus non malignant stricture/focal colitis). This is causing high-grade partial obstruction of proximal colon and small bowel. Increase in amount of free fluid within the pelvis.  Foley catheter with decompression of urinary bladder.  Medullary sponge kidneys.  Previously noted indeterminate liver and right renal lesion not as well delineated on the present exam. Please see prior CT report recommendation. Patient seen in consult by surgery. GI consult placed today 04/28/2017 patient resting in bed with NG tube in place  . He does complain of abdominal pain.  04/29/2017 patient is scheduled to undergo surgery today. He is in good spirits today. Patient had flexible sigmoidoscopy yesterday. 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 Completely obstructing tumor in the distal sigmoid colon was noted. Patient is originally from [place] he is here for work-related issues. He has a wife and 2 children who are not with him at this time.  1/18-patient resting in bed.Complains of some abdominal pain has gas in the colostomy bag. Has NG tube still in place.  1/19-patient sitting up in chair.  NG tube has been DC'd yesterday.  Foley has been DC'd yesterday.  Patient reports no nausea vomiting.  Does have some gas in the colostomy bag.  He has been on ice chips and IV fluids.  05/02/2017 patient up in his room ambulating.  Does have flatus in his  colostomy bag denies any nausea still on IV fluids n.p.o.  Assessment & Plan:   Principal Problem:   Colonic stricture (HCC) Active Problems:   SBO (small bowel obstruction) (HCC)   Hyponatremia   Renal insufficiency   Colonic mass  1]obstructing tumor of distal sigmoid colon.Patientis s/pexploratory laparotomy sigmoid colectomy and end colostomy 04/29/2017-plan per surgery. 2]acute kidney injury continue IV hydration.creatinine down to 1.07. 3]hyponatremia stable. 4]hypertension probably related to anxiety will order IV Lopressor.  Continue as needed hydralazine.       DVT prophylaxis: Lovenox/SCD Code Status full code Family Communication: None Disposition Plan:  Per surgery  Consultants: Surgery  Procedures: Exploratory laparotomy sigmoid colectomy and colostomy. Antimicrobials: None Subjective: Denies any new complaints   Objective: Vitals:   05/01/17 0611 05/01/17 1407 05/01/17 2230 05/02/17 0530  BP: (!) 167/95 139/88 (!) 154/90 (!) 149/92  Pulse: 93 81 87 76  Resp: 16 16 16 18   Temp: 98.1 F (36.7 C) 98.9 F (37.2 C) 98.8 F (37.1 C) 99.3 F (37.4 C)  TempSrc: Oral Oral Oral Oral  SpO2: 97% 98% 98% 97%  Weight:      Height:        Intake/Output Summary (Last 24 hours) at 05/02/2017 0847 Last data filed at 05/02/2017 0530 Gross per 24 hour  Intake 1254.17 ml  Output 0 ml  Net 1254.17 ml   Filed Weights   04/28/17 0500 04/28/17 1510 04/30/17 0438  Weight: 83.6 kg (184 lb 4.9 oz) 83.5 kg (184 lb) 82.9 kg (182 lb 12.2 oz)    Examination:  General exam: Appears calm and comfortable  Respiratory system: Clear to auscultation. Respiratory effort normal. Cardiovascular system: S1 &  S2 heard, RRR. No JVD, murmurs, rubs, gallops or clicks. No pedal edema. Gastrointestinal system: Abdomen is nondistended, soft and nontender. No organomegaly or masses felt. Normal bowel sounds heard. Central nervous system: Alert and oriented. No focal  neurological deficits. Extremities: Symmetric 5 x 5 power. Skin: No rashes, lesions or ulcers Psychiatry: Judgement and insight appear normal. Mood & affect appropriate.     Data Reviewed: I have personally reviewed following labs and imaging studies  CBC: Recent Labs  Lab 04/27/17 1130 04/28/17 0449 04/28/17 2355 04/30/17 0451 05/01/17 0856  WBC 8.9 9.3 10.5 9.9 11.1*  NEUTROABS 6.7  --   --   --   --   HGB 13.5 12.1* 11.3* 9.8* 9.7*  HCT 40.6 36.6* 34.3* 29.5* 29.8*  MCV 92.1 94.1 94.0 92.8 94.0  PLT 427* 417* 354 313 673   Basic Metabolic Panel: Recent Labs  Lab 04/28/17 0449 04/29/17 0501 04/30/17 0451 05/01/17 0856 05/02/17 0413  NA 136 138 137 134* 135  K 4.1 3.9 4.7 3.8 3.8  CL 102 104 104 99* 104  CO2 27 26 28 27 26   GLUCOSE 107* 88 124* 117* 125*  BUN 18 14 19 16 15   CREATININE 1.36* 1.13 1.27* 1.16 1.07  CALCIUM 8.7* 8.5* 8.2* 8.7* 8.3*  MG  --   --   --   --  2.1   GFR: Estimated Creatinine Clearance: 81.5 mL/min (by C-G formula based on SCr of 1.07 mg/dL). Liver Function Tests: Recent Labs  Lab 04/26/17 1337 04/27/17 1130 04/28/17 0449  AST 20 19 15   ALT 17 17 13*  ALKPHOS 102 93 76  BILITOT 0.6 0.7 0.5  PROT 8.6* 8.0 6.7  ALBUMIN 4.1 3.7 3.4*   Recent Labs  Lab 04/26/17 1337  LIPASE 23   No results for input(s): AMMONIA in the last 168 hours. Coagulation Profile: No results for input(s): INR, PROTIME in the last 168 hours. Cardiac Enzymes: No results for input(s): CKTOTAL, CKMB, CKMBINDEX, TROPONINI in the last 168 hours. BNP (last 3 results) No results for input(s): PROBNP in the last 8760 hours. HbA1C: No results for input(s): HGBA1C in the last 72 hours. CBG: Recent Labs  Lab 04/30/17 1728 04/30/17 1958 04/30/17 2350 05/01/17 0359 05/01/17 0801  GLUCAP 87 98 95 92 105*   Lipid Profile: No results for input(s): CHOL, HDL, LDLCALC, TRIG, CHOLHDL, LDLDIRECT in the last 72 hours. Thyroid Function Tests: No results for  input(s): TSH, T4TOTAL, FREET4, T3FREE, THYROIDAB in the last 72 hours. Anemia Panel: No results for input(s): VITAMINB12, FOLATE, FERRITIN, TIBC, IRON, RETICCTPCT in the last 72 hours. Sepsis Labs: Recent Labs  Lab 04/27/17 1145 04/27/17 1503  LATICACIDVEN 1.56 1.71    Recent Results (from the past 240 hour(s))  MRSA PCR Screening     Status: None   Collection Time: 04/29/17  8:50 AM  Result Value Ref Range Status   MRSA by PCR NEGATIVE NEGATIVE Final    Comment:        The GeneXpert MRSA Assay (FDA approved for NASAL specimens only), is one component of a comprehensive MRSA colonization surveillance program. It is not intended to diagnose MRSA infection nor to guide or monitor treatment for MRSA infections.          Radiology Studies: No results found.      Scheduled Meds: . chlorhexidine  15 mL Mouth/Throat BID  . enoxaparin (LOVENOX) injection  40 mg Subcutaneous Q24H   Continuous Infusions: . dextrose 5 % and 0.45 % NaCl  with KCl 20 mEq/L 125 mL/hr at 05/01/17 1658     LOS: 5 days     Georgette Shell, MD [facility]  If 7PM-7AM, please contact night-coverage [email or web address] Password [facility] 05/02/2017, 8:47 AM

## 2017-05-03 DIAGNOSIS — K56609 Unspecified intestinal obstruction, unspecified as to partial versus complete obstruction: Secondary | ICD-10-CM

## 2017-05-03 NOTE — Consult Note (Signed)
[place] Nurse ostomy consult note Stoma type/location: LLQ Colostomy Stomal assessment/size: 2" round pink and moist, edematous Peristomal assessment: intact Treatment options for stomal/peristomal skin: barrier ring Output blood tinged liquid only at this time.  Ostomy pouching: 2pc. 2 3/4" pouch with barrier ring Education provided: POuch change performed.  Patient is quite knowledgeable and asks appropriate questions.  Discussed showering, twice weekly pouch changes.  Written materials left at bedside and he is to complete and sign secure start card and give to next [place] nurse at next session.  HE is able to roll pouch closed and understands to empty when 1/3 full.  Pouch change completed and extra 2 3/4" two piece pouch left at bedside.  Enrolled patient in [place] program: No  Card left at bedside.  Will enroll today.  [place] team will follow for ongoing education and support.  Domenic Moras RN BSN [place] Pager [phone]

## 2017-05-03 NOTE — Progress Notes (Signed)
PROGRESS NOTE    Harry Avila  JZP:[id number] DOB: 1961-03-09 DOA: 04/27/2017 PCP: System, Pcp Not In  Brief Narrative: 57 year old male with no significant past medical history admitted with abdominal pain nausea. Patient noticed a change in his bowel habits with frequent loose bowels. He does complain of increasing mid abdominal pain. He was found to have proximal sigmoid colon circumferential narrowing. CT scan of the abdomenPersistent circumferential narrowing of the proximal sigmoid colon uspicious for malignancy (as versus non malignant stricture/focal colitis). This is causing high-grade partial obstruction of proximal colon and small bowel. Increase in amount of free fluid within the pelvis.  Foley catheter with decompression of urinary bladder.  Medullary sponge kidneys.  Previously noted indeterminate liver and right renal lesion not as well delineated on the present exam. Please see prior CT report recommendation. Patient seen in consult by surgery. GI consult placed today 04/28/2017 patient resting in bed with NG tube in place  . He does complain of abdominal pain.  04/29/2017 patient is scheduled to undergo surgery today. He is in good spirits today. Patient had flexible sigmoidoscopy yesterday. 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 Completely obstructing tumor in the distal sigmoid colon was noted. Patient is originally from [place] he is here for work-related issues. He has a wife and 2 children who are not with him at this time.  1/18-patient resting in bed.Complains of some abdominal pain has gas in the colostomy bag. Has NG tube still in place.  1/19-patient sitting up in chair. NG tube has been DC'd yesterday. Foley has been DC'd yesterday. Patient reports no nausea vomiting. Does have some gas in the colostomy bag. He has been on ice chips and IV fluids.  05/02/2017 patient up in his room ambulating.  Does have flatus in his  colostomy bag denies any nausea still on IV fluids n.p.o.  1/21-patient ambulating in hall way.no stool yet.tolerating clears.has gas in the bag.    Assessment & Plan:   Principal Problem:   Colon malignant obstruction s/p colectomy/colostomy 04/29/2017 Active Problems:   Colonic stricture (HCC)   SBO (small bowel obstruction) (HCC)   Hyponatremia   Renal insufficiency   Colonic mass   Colostomy in place [facility])  1]obstructing tumor of distal sigmoid colon.Patientis s/pexploratory laparotomy sigmoid colectomy and end colostomy 04/29/2017-plan per surgery.pathology pending. 2]acute kidney injury stable with hydration. 3]hyponatremiastable. 4]hypertension bp high persistently.will start coreg.     DVT prophylaxis: lovenox Code Statusfull Family Communication: none Disposition Plan:per surgery Consultants:  surgery Procedures:Exploratory laparotomy sigmoid colectomy and colostomy   Antimicrobialsnone Subjective: No specific complaints  Objective: Vitals:   05/02/17 1420 05/02/17 1500 05/02/17 2155 05/03/17 0612  BP: (!) 147/102 (!) 158/97 (!) 161/95 (!) 154/96  Pulse: 67  78 99  Resp: 16  18 20   Temp: 97.8 F (36.6 C)  97.9 F (36.6 C) 98.8 F (37.1 C)  TempSrc: Oral  Oral Oral  SpO2: 97%  98% 99%  Weight:      Height:        Intake/Output Summary (Last 24 hours) at 05/03/2017 0955 Last data filed at 05/03/2017 4801 Gross per 24 hour  Intake 2488.33 ml  Output 2 ml  Net 2486.33 ml   Filed Weights   04/28/17 0500 04/28/17 1510 04/30/17 0438  Weight: 83.6 kg (184 lb 4.9 oz) 83.5 kg (184 lb) 82.9 kg (182 lb 12.2 oz)    Examination:  General exam: Appears calm and comfortable  Respiratory system: Clear to auscultation. Respiratory effort normal. Cardiovascular system:  S1 & S2 heard, RRR. No JVD, murmurs, rubs, gallops or clicks. No pedal edema. Gastrointestinal system: Abdomen is nondistended, soft and  Mild tender. No organomegaly or masses felt.  Decreased  bowel sounds heard.colostomy in place Central nervous system: Alert and oriented. No focal neurological deficits. Extremities: Symmetric 5 x 5 power. Skin: No rashes, lesions or ulcers Psychiatry: Judgement and insight appear normal. Mood & affect appropriate.     Data Reviewed: I have personally reviewed following labs and imaging studies  CBC: Recent Labs  Lab 04/27/17 1130 04/28/17 0449 04/28/17 2355 04/30/17 0451 05/01/17 0856  WBC 8.9 9.3 10.5 9.9 11.1*  NEUTROABS 6.7  --   --   --   --   HGB 13.5 12.1* 11.3* 9.8* 9.7*  HCT 40.6 36.6* 34.3* 29.5* 29.8*  MCV 92.1 94.1 94.0 92.8 94.0  PLT 427* 417* 354 313 630   Basic Metabolic Panel: Recent Labs  Lab 04/28/17 0449 04/29/17 0501 04/30/17 0451 05/01/17 0856 05/02/17 0413  NA 136 138 137 134* 135  K 4.1 3.9 4.7 3.8 3.8  CL 102 104 104 99* 104  CO2 27 26 28 27 26   GLUCOSE 107* 88 124* 117* 125*  BUN 18 14 19 16 15   CREATININE 1.36* 1.13 1.27* 1.16 1.07  CALCIUM 8.7* 8.5* 8.2* 8.7* 8.3*  MG  --   --   --   --  2.1   GFR: Estimated Creatinine Clearance: 81.5 mL/min (by C-G formula based on SCr of 1.07 mg/dL). Liver Function Tests: Recent Labs  Lab 04/26/17 1337 04/27/17 1130 04/28/17 0449  AST 20 19 15   ALT 17 17 13*  ALKPHOS 102 93 76  BILITOT 0.6 0.7 0.5  PROT 8.6* 8.0 6.7  ALBUMIN 4.1 3.7 3.4*   Recent Labs  Lab 04/26/17 1337  LIPASE 23   No results for input(s): AMMONIA in the last 168 hours. Coagulation Profile: No results for input(s): INR, PROTIME in the last 168 hours. Cardiac Enzymes: No results for input(s): CKTOTAL, CKMB, CKMBINDEX, TROPONINI in the last 168 hours. BNP (last 3 results) No results for input(s): PROBNP in the last 8760 hours. HbA1C: No results for input(s): HGBA1C in the last 72 hours. CBG: Recent Labs  Lab 04/30/17 1728 04/30/17 1958 04/30/17 2350 05/01/17 0359 05/01/17 0801  GLUCAP 87 98 95 92 105*   Lipid Profile: No results for input(s): CHOL,  HDL, LDLCALC, TRIG, CHOLHDL, LDLDIRECT in the last 72 hours. Thyroid Function Tests: No results for input(s): TSH, T4TOTAL, FREET4, T3FREE, THYROIDAB in the last 72 hours. Anemia Panel: No results for input(s): VITAMINB12, FOLATE, FERRITIN, TIBC, IRON, RETICCTPCT in the last 72 hours. Sepsis Labs: Recent Labs  Lab 04/27/17 1145 04/27/17 1503  LATICACIDVEN 1.56 1.71    Recent Results (from the past 240 hour(s))  MRSA PCR Screening     Status: None   Collection Time: 04/29/17  8:50 AM  Result Value Ref Range Status   MRSA by PCR NEGATIVE NEGATIVE Final    Comment:        The GeneXpert MRSA Assay (FDA approved for NASAL specimens only), is one component of a comprehensive MRSA colonization surveillance program. It is not intended to diagnose MRSA infection nor to guide or monitor treatment for MRSA infections.          Radiology Studies: No results found.      Scheduled Meds: . acetaminophen  1,000 mg Oral TID  . chlorhexidine  15 mL Mouth/Throat BID  . enoxaparin (LOVENOX) injection  40  mg Subcutaneous Q24H  . sodium chloride flush  3 mL Intravenous Q12H   Continuous Infusions: . sodium chloride    . lactated ringers    . methocarbamol (ROBAXIN)  IV       LOS: 6 days      Georgette Shell, MD [facility] -7AM, please contact night-coverage [email or web address] Password [facility] 05/03/2017, 9:55 AM

## 2017-05-03 NOTE — Progress Notes (Signed)
[facility]/Trauma Progress Note  4 Days Post-Op   Assessment/Plan Principal Problem:   Colon malignant obstruction s/p colectomy/colostomy 04/29/2017 Active Problems:   Colonic stricture (HCC)   SBO (small bowel obstruction) (HCC)   Hyponatremia   Renal insufficiency   Colonic mass   Colostomy in place ([place])   Obstructing colon mass - SEGMENTAL COLON RESECTION WITH COLOSTOMY- 04/29/2017 - Dalbert Batman - gas in bag, not much stool, slow to advance diet until good bowel function - path and CEA pending  FEN: DYS 1 VTE: SCD's, lovenox ID: [email or web address] 01/16-01/18 Foley: No Follow up: Dr. Dalbert Batman    LOS: 6 days    Subjective: CC: abdominal soreness  Not taking much pain medicine. Tolerating diet. Minimal pain. Ambulating.   Objective: Vital signs in last 24 hours: Temp:  [97.8 F (36.6 C)-98.8 F (37.1 C)] 98.8 F (37.1 C) (01/21 0612) Pulse Rate:  [67-99] 99 (01/21 0612) Resp:  [16-20] 20 (01/21 0612) BP: (147-161)/(95-102) 154/96 (01/21 0612) SpO2:  [97 %-99 %] 99 % (01/21 0612) Last BM Date: 04/25/17  Intake/Output from previous day: 01/20 0701 - 01/21 0700 In: 2488.3 [P.O.:480; I.V.:2008.3] Out: 2 [Urine:2] Intake/Output this shift: No intake/output data recorded.  PE: Gen:  Alert, NAD, pleasant, cooperative Card:  RRR, no M/G/R heard Pulm:  CTA, no W/R/R, effort normal Abd: Soft, not distended, hypoactive BS, incision with staples is without drainage or surrounding erythema, ostomy pink, no gas or stool in bag, Pt states bag was changed 3 hours ago Skin: no rashes noted, warm and dry   Anti-infectives: Anti-infectives (From admission, onward)   Start     Dose/Rate Route Frequency Ordered Stop   04/30/17 0000  cefoTEtan (CEFOTAN) 2 g in dextrose 5 % 50 mL IVPB     2 g 100 mL/hr over 30 Minutes Intravenous Every 12 hours 04/29/17 1555 04/30/17 0058   04/29/17 1315  cefoTEtan (CEFOTAN) 2 g in dextrose 5 % 50 mL IVPB     2 g 100 mL/hr over 30  Minutes Intravenous On call to O.R. 04/28/17 1942 04/29/17 1212   04/29/17 0916  cefoTEtan in Dextrose 5% (CEFOTAN) 2-2.08 GM-%(50ML) IVPB    Comments:  Bridget Hartshorn   : cabinet override      04/29/17 0916 04/29/17 2129   04/29/17 0600  cefoTEtan (CEFOTAN) 2 g in dextrose 5 % 50 mL IVPB  Status:  Discontinued     2 g 100 mL/hr over 30 Minutes Intravenous On call to O.R. 04/28/17 1902 04/28/17 1942      Lab Results:  Recent Labs    05/01/17 0856  WBC 11.1*  HGB 9.7*  HCT 29.8*  PLT 320   BMET Recent Labs    05/01/17 0856 05/02/17 0413  NA 134* 135  K 3.8 3.8  CL 99* 104  CO2 27 26  GLUCOSE 117* 125*  BUN 16 15  CREATININE 1.16 1.07  CALCIUM 8.7* 8.3*   PT/INR No results for input(s): LABPROT, INR in the last 72 hours. CMP     Component Value Date/Time   NA 135 05/02/2017 0413   K 3.8 05/02/2017 0413   CL 104 05/02/2017 0413   CO2 26 05/02/2017 0413   GLUCOSE 125 (H) 05/02/2017 0413   BUN 15 05/02/2017 0413   CREATININE 1.07 05/02/2017 0413   CALCIUM 8.3 (L) 05/02/2017 0413   PROT 6.7 04/28/2017 0449   ALBUMIN 3.4 (L) 04/28/2017 0449   AST 15 04/28/2017 0449   ALT 13 (L) 04/28/2017 0449  ALKPHOS 76 04/28/2017 0449   BILITOT 0.5 04/28/2017 0449   GFRNONAA >60 05/02/2017 0413   GFRAA >60 05/02/2017 0413   Lipase     Component Value Date/Time   LIPASE 23 04/26/2017 1337    Studies/Results: No results found.    Kalman Drape , [facility] 05/03/2017, 12:51 PM Pager: [phone] Consults: [phone] Mon-Fri 7:00 am-4:30 pm Sat-Sun 7:00 am-11:30 am

## 2017-05-03 NOTE — Progress Notes (Signed)
Lab tech states that patient declined to have his blood work drawn this morning stating that "the PA told me yesterday that they were switching it to every other day, so today is not the day that it should be done." This writer did confirm with patient that this was what he had informed the lab tech/phlebotomist and this note is to indicate/confirm patient's declination to have blood work drawn this morning.

## 2017-05-03 NOTE — Progress Notes (Signed)
   05/03/17 1936  Colostomy LLQ  Placement Date/Time: 04/29/17 1323   Person Inserting Catheter: Dr. Dalbert Batman  Location: LLQ  Output (mL) 150 mL  Stool Characteristics  Bowel Incontinence No  Stool Type Type 6 (Mushy consistency with ragged edges)  Has the patient had three Type 7 stools in the last 24 hours? No  Stool Descriptors [employer] Colostomy

## 2017-05-04 ENCOUNTER — Inpatient Hospital Stay (HOSPITAL_COMMUNITY): Payer: BLUE CROSS/BLUE SHIELD

## 2017-05-04 ENCOUNTER — Encounter (HOSPITAL_COMMUNITY): Payer: Self-pay | Admitting: Radiology

## 2017-05-04 LAB — CEA: CEA: 2.7 ng/mL (ref 0.0–4.7)

## 2017-05-04 MED ORDER — MORPHINE SULFATE (PF) 2 MG/ML IV SOLN: 1.0000 mg | INTRAVENOUS | Status: DC | PRN

## 2017-05-04 MED ORDER — CARVEDILOL 3.125 MG PO TABS
3.1250 mg | ORAL_TABLET | Freq: Two times a day (BID) | ORAL | Status: DC
  Administered 2017-05-05: 3.125 mg via ORAL
  Filled 2017-05-04: qty 1

## 2017-05-04 MED ORDER — IOPAMIDOL (ISOVUE-300) INJECTION 61%
INTRAVENOUS | Status: AC
  Administered 2017-05-04: 75 mL
  Filled 2017-05-04: qty 75

## 2017-05-04 NOTE — Progress Notes (Signed)
PROGRESS NOTE    Harry Avila  JOA:[id number] DOB: 10/30/1960 DOA: 04/27/2017 PCP: System, Pcp Not In  Brief Narrative:  57 year old male with no significant past medical history admitted with abdominal pain nausea. Patient noticed a change in his bowel habits with frequent loose bowels. He does complain of increasing mid abdominal pain. He was found to have proximal sigmoid colon circumferential narrowing. CT scan of the abdomenPersistent circumferential narrowing of the proximal sigmoid colon uspicious for malignancy (as versus non malignant stricture/focal colitis). This is causing high-grade partial obstruction of proximal colon and small bowel. Increase in amount of free fluid within the pelvis.  Foley catheter with decompression of urinary bladder.  Medullary sponge kidneys.  Previously noted indeterminate liver and right renal lesion not as well delineated on the present exam. Please see prior CT report recommendation. Patient seen in consult by surgery. GI consult placed today 04/28/2017 patient resting in bed with NG tube in place  . He does complain of abdominal pain.  04/29/2017 patient is scheduled to undergo surgery today. He is in good spirits today. Patient had flexible sigmoidoscopy yesterday. 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 Completely obstructing tumor in the distal sigmoid colon was noted. Patient is originally from [place] he is here for work-related issues. He has a wife and 2 children who are not with him at this time.  1/18-patient resting in bed.Complains of some abdominal pain has gas in the colostomy bag. Has NG tube still in place.  1/19-patient sitting up in chair. NG tube has been DC'd yesterday. Foley has been DC'd yesterday. Patient reports no nausea vomiting. Does have some gas in the colostomy bag. He has been on ice chips and IV fluids.  05/02/2017 patient up in his room ambulating. Does have flatus in his  colostomy bag denies any nausea still on IV fluids n.p.o.  1/21-patient ambulating in hall way.no stool yet.tolerating clears.has gas in the bag.       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  No marginal involvement.  I have placed a call to Dr. Lebron Conners who has agreed to see the patient today. 2]acute kidney injury stable with hydration. 3]hyponatremiastable. 4]hypertension bp high persistently.will start coreg.     DVT prophylaxis: Lovenox full code Code Status:full Family Communication:none Disposition Plan dc when ok with surgery.hopefully in 1 to 2 days. Consultants: surgery  Procedures: Exploratory laparotomy sigmoid colectomy and colostomy      Antimicrobials:none  Subjective:no new c/o   Objective: Vitals:   05/02/17 2155 05/03/17 0612 05/03/17 2252 05/04/17 0551  BP: (!) 161/95 (!) 154/96 (!) 149/81 140/84  Pulse: 78 99 85 81  Resp: 18 20 20 20   Temp: 97.9 F (36.6 C) 98.8 F (37.1 C) 98.8 F (37.1 C) 98.6 F (37 C)  TempSrc: Oral Oral Oral Oral  SpO2: 98% 99% 98% 99%  Weight:      Height:        Intake/Output Summary (Last 24 hours) at 05/04/2017 1256 Last data filed at 05/04/2017 0918 Gross per 24 hour  Intake 1203 ml  Output 250 ml  Net 953 ml  Filed Weights   04/28/17 0500 04/28/17 1510 04/30/17 0438  Weight: 83.6 kg (184 lb 4.9 oz) 83.5 kg (184 lb) 82.9 kg (182 lb 12.2 oz)    Examination:  General exam: Appears calm and comfortable  Respiratory system: Clear  to auscultation. Respiratory effort normal. Cardiovascular system: S1 & S2 heard, RRR. No JVD, murmurs, rubs, gallops or clicks. No pedal edema. Gastrointestinal system: Abdomen is nondistended, soft and nontender. No organomegaly or masses felt. Normal bowel sounds heard.  Ostomy bag in place. Central nervous system: Alert and oriented. No focal neurological deficits. Extremities: Symmetric 5 x 5 power. Skin: No rashes, lesions or ulcers Psychiatry: Judgement and insight appear normal. Mood & affect appropriate.     Data Reviewed: I have personally reviewed following labs and imaging studies  CBC: Recent Labs  Lab 04/28/17 0449 04/28/17 2355 04/30/17 0451 05/01/17 0856  WBC 9.3 10.5 9.9 11.1*  HGB 12.1* 11.3* 9.8* 9.7*  HCT 36.6* 34.3* 29.5* 29.8*  MCV 94.1 94.0 92.8 94.0  PLT 417* 354 313 696   Basic Metabolic Panel: Recent Labs  Lab 04/28/17 0449 04/29/17 0501 04/30/17 0451 05/01/17 0856 05/02/17 0413  NA 136 138 137 134* 135  K 4.1 3.9 4.7 3.8 3.8  CL 102 104 104 99* 104  CO2 27 26 28 27 26   GLUCOSE 107* 88 124* 117* 125*  BUN 18 14 19 16 15   CREATININE 1.36* 1.13 1.27* 1.16 1.07  CALCIUM 8.7* 8.5* 8.2* 8.7* 8.3*  MG  --   --   --   --  2.1   GFR: Estimated Creatinine Clearance: 81.5 mL/min (by C-G formula based on SCr of 1.07 mg/dL). Liver Function Tests: Recent Labs  Lab 04/28/17 0449  AST 15  ALT 13*  ALKPHOS 76  BILITOT 0.5  PROT 6.7  ALBUMIN 3.4*   No results for input(s): LIPASE, AMYLASE in the last 168 hours. No results for input(s): AMMONIA in the last 168 hours. Coagulation Profile: No results for input(s): INR, PROTIME in the last 168 hours. Cardiac Enzymes: No results for input(s): CKTOTAL, CKMB, CKMBINDEX, TROPONINI in the last 168 hours. BNP (last 3 results) No results for input(s): PROBNP in the last 8760 hours. HbA1C: No results for input(s): HGBA1C in the last 72 hours. CBG: Recent Labs  Lab 04/30/17 1728 04/30/17 1958  04/30/17 2350 05/01/17 0359 05/01/17 0801  GLUCAP 87 98 95 92 105*   Lipid Profile: No results for input(s): CHOL, HDL, LDLCALC, TRIG, CHOLHDL, LDLDIRECT in the last 72 hours. Thyroid Function Tests: No results for input(s): TSH, T4TOTAL, FREET4, T3FREE, THYROIDAB in the last 72 hours. Anemia Panel: No results for input(s): VITAMINB12, FOLATE, FERRITIN, TIBC, IRON, RETICCTPCT in the last 72 hours. Sepsis Labs: Recent Labs  Lab 04/27/17 1503  LATICACIDVEN 1.71    Recent Results (from the past 240 hour(s))  MRSA PCR Screening     Status: None   Collection Time: 04/29/17  8:50 AM  Result Value Ref Range Status   MRSA by PCR NEGATIVE NEGATIVE Final    Comment:        The GeneXpert MRSA Assay (FDA approved for NASAL specimens only), is one component of a comprehensive MRSA colonization surveillance program. It is not intended to diagnose MRSA infection nor to guide or monitor treatment for MRSA infections.          Radiology Studies: No results found.      Scheduled Meds: . acetaminophen  1,000 mg Oral TID  . chlorhexidine  15 mL  Mouth/Throat BID  . enoxaparin (LOVENOX) injection  40 mg Subcutaneous Q24H  . sodium chloride flush  3 mL Intravenous Q12H   Continuous Infusions: . sodium chloride    . lactated ringers    . methocarbamol (ROBAXIN)  IV       LOS: 7 days       Georgette Shell, MD [facility]  If 7PM-7AM, please contact night-coverage [email or web address] Password [facility] 05/04/2017, 12:56 PM

## 2017-05-04 NOTE — Consult Note (Addendum)
[place] Nurse ostomy follow up Stoma type/location: LLQ colostomy Stomal assessment/size: 2 inches round (per measurement yesterday) Peristomal assessment: not seen today Treatment options for stomal/peristomal skin: skin barrier ring Output: brown stool with flatus  Ostomy pouching: 2pc 2 and 3/4 inch today and initially, then decrease to 2 and 1/4 inch supply. Supplies provided for 3 weeks. Education provided: Extended session for discussion about  Enrolled patient in [employer] Discharge program:  Done by my partner, K. Sanders. Discussed belt, diet, clothing, travel, closed end pouches, web based decision support tool (WithoutTaxes.cz), ongoing support and questions.  Friend in to hear last portion of lesson.  [place] nursing team will follow while in house,  and will remain available to this patient, the nursing and medical teams.   Thanks, Maudie Flakes, MSN, RN, [place], Arther Abbott  Pager# [phone]

## 2017-05-04 NOTE — Consult Note (Signed)
: New Hematology/Oncology Consult   Referral MD: Dr Elizabeth G Mathews  Reason for Referral: New diagnosis of adenoma carcinoma of the colon.  HPI:  Harry Avila is a 56 y.o. currently admitted to the hospital following surgery for new diagnosis of colon cancer.  Please see oncological history for detail.  His past medical history is significant for diabetes mellitus without complications.  No previous history of cancer.  Other than father having a prostate cancer, no other familial malignancies.   The patient was initially admitted on 04/27/17 with upper abdominal discomfort over 2-3 days, nausea, mild emesis.  No significant weight loss, fevers, chills, no melena or hematochezia.  Subsequent imaging demonstrated a mass in the sigmoid colon and patient underwent surgical exploration.  Currently he is recovering from the surgery.  Remains very active, no problems with incision so far.  Denies interval fevers, chills, night sweats.  No respiratory or genitourinary complaints.  Oncological History: --CT A/P, 04/27/17: Hepatobiliary: 11 mm posterior right lobe of indeterminate hypodense lesion. Vicarious excretion of contrast in the gallbladder. Pancreas: Taking into account limitation by non contrast imaging, no pancreatic mass or inflammation. Spleen: Taking into account limitation by non contrast imaging, no splenic mass or enlargement. Adrenals/Urinary Tract: Dense pyramids bilaterally suggestive of medullary sponge disease without obstructing stone or hydronephrosis. 1.7 cm indeterminate lesion anterior aspect midportion right kidney better delineated on prior CT. Urinary bladder decompressed with Foley catheter. No adrenal mass. Stomach/Bowel: Persistent circumferential narrowing of the proximal sigmoid colon. This does not cause complete obstruction as gas has traversed beyond this region however, proximal large and small bowel dilated consistent with high-grade partial obstruction.  Vascular/Lymphatic: No aortic aneurysm.  No adenopathy. --Labs, 04/28/17: CEA 4.1; Hgb 12.1; tBili 0.5, AP 76, AST 15, ALT 13 --Sigmoidoscopy, 04/28/17: The perianal and digital rectal examinations were normal. An infiltrative completely obstructing large mass was found in the distal sigmoid colon at 30cm from anal verge. The mass was circumferential. Oozing was present. This was biopsied with a cold forceps for histology. Area was tattooed with an injection of 5 mL of Spot (carbon black) in the distal fold at 25cm. Pathology -- The biopsies are involved by moderately differentiated colorectal adenocarcinoma. --Surgery (Haywood M. Ingram, M.D., FACS), 04/29/17: Exploratory laparotomy, sigmoid colectomy, end colostomy. There was a 3 cm mass in the distal sigmoid colon that was completely obstructing.  The colon and small bowel proximal to this were significantly distended but there was no perforation or ischemia.  I could see the extensive tattoo markings and some of the [place] 8 used for tattoo was found to the extraluminal area the mesenteric lymph nodes were not enlarged but a few of them contained the [place] ink.  The peritoneal surface and liver felt normal. Pathology -- INVASIVE COLORECTAL ADENOCARCINOMA FOCALLY EXTENDING TO SEROSAL SURFACE. MARGINS NOT INVOLVED. SEVEN BENIGN LYMPH NODES (0/7). Invasive tumor -- Maximum size 5cm, Histologic grade and differentiation -- G2 (moderately differentiated/low grade), No residual polyp, Microscopic extension of invasive tumor -- Into pericolonic connective tissue and focally to serosal surface, Lymph-Vascular invasion not identified, Peri-neural invasion not identified, No tumor deposit(s) (discontinuous extramural extension); All resection margins free of tumor, distance closest margin -- 2 cm from mesenteric margins. Lymph nodes -- number examined 7, number positive 0;      History reviewed. No pertinent past medical history.:  Past Surgical History:   Procedure Laterality Date  . COLECTOMY WITH COLOSTOMY CREATION/HARTMANN PROCEDURE N/A 04/29/2017   Procedure: SEGMENTAL COLON RESECTION WITH COLOSTOMY;    Surgeon: Fanny Skates, MD;  Location: [facility] ORS;  Service: General;  Laterality: N/A;  . FLEXIBLE SIGMOIDOSCOPY N/A 04/28/2017   Procedure: FLEXIBLE SIGMOIDOSCOPY;  Surgeon: Mauri Pole, MD;  Location: [facility] ENDOSCOPY;  Service: Endoscopy;  Laterality: N/A;  :   Current Facility-Administered Medications:  .  0.9 %  sodium chloride infusion, 250 mL, Intravenous, PRN, Michael Boston, MD .  acetaminophen (TYLENOL) tablet 1,000 mg, 1,000 mg, Oral, TID, Michael Boston, MD, 1,000 mg at 05/03/17 2244 .  carvedilol (COREG) tablet 3.125 mg, 3.125 mg, Oral, BID WC, Georgette Shell, MD .  chlorhexidine (PERIDEX) 0.12 % solution 15 mL, 15 mL, Mouth/Throat, BID, Jani Gravel, MD, 15 mL at 05/03/17 2245 .  enoxaparin (LOVENOX) injection 40 mg, 40 mg, Subcutaneous, Q24H, Alphonsa Overall, MD, 40 mg at 05/04/17 1031 .  hydrALAZINE (APRESOLINE) injection 10 mg, 10 mg, Intravenous, Q6H PRN, Georgette Shell, MD .  lip balm (CARMEX) ointment, , Topical, PRN, Fanny Skates, MD .  methocarbamol (ROBAXIN) 1,000 mg in dextrose 5 % 50 mL IVPB, 1,000 mg, Intravenous, Q6H PRN, Michael Boston, MD .  methocarbamol (ROBAXIN) tablet 1,000 mg, 1,000 mg, Oral, Q6H PRN, Michael Boston, MD .  metoprolol tartrate (LOPRESSOR) injection 5 mg, 5 mg, Intravenous, Q6H PRN, Blount, Xenia T, NP .  morphine 2 MG/ML injection 1-4 mg, 1-4 mg, Intravenous, Q4H PRN, Meuth, Brooke A, PA-C .  ondansetron (ZOFRAN-ODT) disintegrating tablet 4 mg, 4 mg, Oral, Q6H PRN **OR** ondansetron (ZOFRAN) injection 4 mg, 4 mg, Intravenous, Q6H PRN, Fanny Skates, MD, 4 mg at 04/30/17 2312 .  oxyCODONE (Oxy IR/ROXICODONE) immediate release tablet 5-10 mg, 5-10 mg, Oral, Q4H PRN, Michael Boston, MD .  phenol (CHLORASEPTIC) mouth spray 1 spray, 1 spray, Mouth/Throat, PRN, Jani Gravel, MD .  sodium  chloride flush (NS) 0.9 % injection 3 mL, 3 mL, Intravenous, Q12H, Michael Boston, MD, 3 mL at 05/04/17 1031 .  sodium chloride flush (NS) 0.9 % injection 3 mL, 3 mL, Intravenous, PRN, Michael Boston, MD:  . acetaminophen  1,000 mg Oral TID  . carvedilol  3.125 mg Oral BID WC  . chlorhexidine  15 mL Mouth/Throat BID  . enoxaparin (LOVENOX) injection  40 mg Subcutaneous Q24H  . sodium chloride flush  3 mL Intravenous Q12H  :  No Known Allergies:   Family History  Problem Relation Age of Onset  . Prostate cancer Father    Social History   Socioeconomic History  . Marital status: Married    Spouse name: Not on file  . Number of children: Not on file  . Years of education: Not on file  . Highest education level: Not on file  Social Needs  . Financial resource strain: Not on file  . Food insecurity - worry: Not on file  . Food insecurity - inability: Not on file  . Transportation needs - medical: Not on file  . Transportation needs - non-medical: Not on file  Occupational History  . Not on file  Tobacco Use  . Smoking status: Never Smoker  . Smokeless tobacco: Never Used  Substance and Sexual Activity  . Alcohol use: No    Frequency: Never  . Drug use: No  . Sexual activity: Not on file  Other Topics Concern  . Not on file  Social History Narrative  . Not on file    Review of Systems: Per HPI.  All other systems are negative.  Physical Exam:  Blood pressure (!) 161/88, pulse 76, temperature 98.4 F (36.9 C),  temperature source Oral, resp. rate 20, height 5' 10" (1.778 m), weight 182 lb 12.2 oz (82.9 kg), SpO2 99 %.  Patient is alert, awake, oriented x3, no acute distress HEENT: Anicteric sclera, moist mucous membranes. Lungs: Clear to auscultation bilaterally no wheezing Cardiac: S1/S2, regular, no murmurs, rubs, gallops. Abdomen: Mildly distended.  Minimally tender in the lower portion.  Surgical incision in the midline lower abdomen with intact staple line  without evidence of fluid collection, dehiscence, or bleeding. Lymph nodes: No palpable lymphadenopathy in the cervical, supraclavicular, axillary, or inguinal regions. Neurologic: No gross focal neurological deficits Skin: No petechia, rash, or blisters Musculoskeletal: Unremarkable with no lower extremity edema.  LABS:  No results for input(s): WBC, HGB, HCT, PLT in the last 72 hours.  Recent Labs    05/02/17 0413  NA 135  K 3.8  CL 104  CO2 26  GLUCOSE 125*  BUN 15  CREATININE 1.07  CALCIUM 8.3*      RADIOLOGY:  Ct Abdomen Pelvis Wo Contrast  Result Date: 04/27/2017 CLINICAL DATA:  57 year old male with suprapubic pain for 2 days. Subsequent encounter. EXAM: CT ABDOMEN AND PELVIS WITHOUT CONTRAST TECHNIQUE: Multidetector CT imaging of the abdomen and pelvis was performed following the standard protocol without IV contrast. COMPARISON:  04/26/2017 CT. FINDINGS: Lower chest: Basilar subsegmental atelectasis. Heart size within normal limits. Hepatobiliary: 11 mm posterior right lobe of indeterminate hypodense lesion. Vicarious excretion of contrast in the gallbladder. Pancreas: Taking into account limitation by non contrast imaging, no pancreatic mass or inflammation. Spleen: Taking into account limitation by non contrast imaging, no splenic mass or enlargement. Adrenals/Urinary Tract: Dense pyramids bilaterally suggestive of medullary sponge disease without obstructing stone or hydronephrosis. 1.7 cm indeterminate lesion anterior aspect midportion right kidney better delineated on prior CT. Urinary bladder decompressed with Foley catheter. No adrenal mass. Stomach/Bowel: Persistent circumferential narrowing of the proximal sigmoid colon. This does not cause complete obstruction as gas has traversed beyond this region however, proximal large and small bowel dilated consistent with high-grade partial obstruction. Vascular/Lymphatic: No aortic aneurysm.  No adenopathy. Reproductive:  Prostate gland unremarkable. Other: Increase amount of free fluid most prominent pelvic region. No free intraperitoneal air. No bowel containing hernia. Musculoskeletal: Coarse calcifications anterior to left pubic symphysis may be related prior trauma. IMPRESSION: Persistent circumferential narrowing of the proximal sigmoid colon suspicious for malignancy (as versus non malignant stricture/focal colitis). This is causing high-grade partial obstruction of proximal colon and small bowel. Increase in amount of free fluid within the pelvis. Foley catheter with decompression of urinary bladder. Medullary sponge kidneys. Previously noted indeterminate liver and right renal lesion not as well delineated on the present exam. Please see prior CT report recommendation. These results were called by telephone at the time of interpretation on 04/27/2017 at 3:05 pm to Dr. Theotis Burrow , who verbally acknowledged these results. Electronically Signed   By: Genia Del M.D.   On: 04/27/2017 15:08   Ct Abdomen Pelvis W Contrast  Addendum Date: 04/26/2017   ADDENDUM REPORT: 04/26/2017 17:15 ADDENDUM: After review of the images, there is possible focal narrowing at the sigmoid colon, series 2, image number 45 with colon upstream to this filled with gas and liquid stools. Findings could be secondary to spasm within the colon or possible small focal area of colitis. Follow-up examination with oral contrast could be helpful if there is high suspicion for obstructive process. Electronically Signed   By: Donavan Foil M.D.   On: 04/26/2017 17:15   Result Date:  04/26/2017 CLINICAL DATA:  Pain across the entire abdomen at the umbilicus with nausea and vomiting EXAM: CT ABDOMEN AND PELVIS WITH CONTRAST TECHNIQUE: Multidetector CT imaging of the abdomen and pelvis was performed using the standard protocol following bolus administration of intravenous contrast. CONTRAST:  100mL ISOVUE-300 IOPAMIDOL (ISOVUE-300) INJECTION 61% COMPARISON:   None. FINDINGS: Lower chest: Lung bases demonstrate no acute consolidation or pleural effusion. Normal heart size. Hepatobiliary: Subcentimeter hypodensity anterior aspect of the liver too small to further characterize. 11 mm intermediate density lesion posterior right hepatic lobe. No calcified gallstones or biliary dilatation Pancreas: Unremarkable. No pancreatic ductal dilatation or surrounding inflammatory changes. Spleen: Normal in size without focal abnormality. Adrenals/Urinary Tract: Adrenal glands are within normal limits. No hydronephrosis. 17 mm intermediate density lesion mid pole right kidney. Distended urinary bladder Stomach/Bowel: Stomach nonenlarged. Diffuse fluid-filled small and large bowel. Negative appendix. No colon wall thickening. Vascular/Lymphatic: No significant vascular findings are present. No enlarged abdominal or pelvic lymph nodes. Reproductive: Prostate is unremarkable. Other: Negative for free air. Tiny amount of fluid in the right lower quadrant. Musculoskeletal: Coarse dystrophic appearing calcification anterior to the left pubic bone, could relate to prior trauma or hematoma. No acute or suspicious lesion IMPRESSION: 1. Negative for bowel obstruction, appendicitis, or bowel wall thickening. Scattered fluid-filled loops of small bowel with mild gaseous and fluid enlargement of the colon, query enteritis 2. Tiny amount of free fluid in the right lower quadrant 3. Indeterminate hypodense lesions in the right hepatic lobe and right kidney. When the patient is clinically stable and able to follow directions and hold their breath (preferably as an outpatient) further evaluation with dedicated abdominal MRI should be considered. Electronically Signed: By: Kim  Fujinaga M.D. On: 04/26/2017 15:50    Assessment and Plan:  56-year-old male in excellent state of health and physical shape with diagnosis of invasive adenocarcinoma of the sigmoid colon, now status post surgical resection  demonstrating pT4a pN0 cM0, Stage IIB disease.  Solitary negative predictor is the number of simple lymph nodes which were only 7.  At this time, patient is recovering from his surgery.  After period of recovery, I would like to meet with the patient and offer him adjuvant systemic chemotherapy trace of which will depend on the MSI status of the tumor that is currently pending.  Plan: -CT chest to complete staging -Return to my clinic in 3-4 weeks to review of the chemotherapy plan.    Mikhail G Perlov, MD 05/04/2017, 6:39 PM  

## 2017-05-04 NOTE — Progress Notes (Signed)
[facility] Progress Note  5 Days Post-Op  Subjective: CC-  No complaints this morning. Denies any pain. Patient is not taking any pain medication. Tolerating pureed foods. Denies n/v. Having good output from colostomy.  Lives in [place] and will be flying home when discharged from the hospital; he does still plan to follow up with Dr. Dalbert Batman  Objective: Vital signs in last 24 hours: Temp:  [98.6 F (37 C)-98.8 F (37.1 C)] 98.6 F (37 C) (01/22 0551) Pulse Rate:  [81-85] 81 (01/22 0551) Resp:  [20] 20 (01/22 0551) BP: (140-149)/(81-84) 140/84 (01/22 0551) SpO2:  [98 %-99 %] 99 % (01/22 0551) Last BM Date: 05/03/17  Intake/Output from previous day: 01/21 0701 - 01/22 0700 In: 963 [P.O.:960; I.V.:3] Out: 250 [Stool:250] Intake/Output this shift: No intake/output data recorded.  PE: Gen:  Alert, NAD, pleasant HEENT: EOM's intact, pupils equal and round Card:  RRR, no M/G/R heard Pulm:  CTAB, no W/R/R, effort normal Abd: Soft, mild distension, + BS, midline incision C/D/I with staples intact and no erythema or drainage, LLQ ostomy pink with air and stool in bag Psych: A&Ox3  Skin: no rashes noted, warm and dry   Lab Results:  Recent Labs    05/01/17 0856  WBC 11.1*  HGB 9.7*  HCT 29.8*  PLT 320   BMET Recent Labs    05/01/17 0856 05/02/17 0413  NA 134* 135  K 3.8 3.8  CL 99* 104  CO2 27 26  GLUCOSE 117* 125*  BUN 16 15  CREATININE 1.16 1.07  CALCIUM 8.7* 8.3*   PT/INR No results for input(s): LABPROT, INR in the last 72 hours. CMP     Component Value Date/Time   NA 135 05/02/2017 0413   K 3.8 05/02/2017 0413   CL 104 05/02/2017 0413   CO2 26 05/02/2017 0413   GLUCOSE 125 (H) 05/02/2017 0413   BUN 15 05/02/2017 0413   CREATININE 1.07 05/02/2017 0413   CALCIUM 8.3 (L) 05/02/2017 0413   PROT 6.7 04/28/2017 0449   ALBUMIN 3.4 (L) 04/28/2017 0449   AST 15 04/28/2017 0449   ALT 13 (L) 04/28/2017 0449   ALKPHOS 76 04/28/2017  0449   BILITOT 0.5 04/28/2017 0449   GFRNONAA >60 05/02/2017 0413   GFRAA >60 05/02/2017 0413   Lipase     Component Value Date/Time   LIPASE 23 04/26/2017 1337       Studies/Results: No results found.  Anti-infectives: Anti-infectives (From admission, onward)   Start     Dose/Rate Route Frequency Ordered Stop   04/30/17 0000  cefoTEtan (CEFOTAN) 2 g in dextrose 5 % 50 mL IVPB     2 g 100 mL/hr over 30 Minutes Intravenous Every 12 hours 04/29/17 1555 04/30/17 0058   04/29/17 1315  cefoTEtan (CEFOTAN) 2 g in dextrose 5 % 50 mL IVPB     2 g 100 mL/hr over 30 Minutes Intravenous On call to O.R. 04/28/17 1942 04/29/17 1212   04/29/17 0916  cefoTEtan in Dextrose 5% (CEFOTAN) 2-2.08 GM-%(50ML) IVPB    Comments:  Bridget Hartshorn   : cabinet override      04/29/17 0916 04/29/17 2129   04/29/17 0600  cefoTEtan (CEFOTAN) 2 g in dextrose 5 % 50 mL IVPB  Status:  Discontinued     2 g 100 mL/hr over 30 Minutes Intravenous On call to O.R. 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 SEVEN BENIGN LYMPH NODES (0/7) - CEA 2.7 (1/21)  ID - cefotetan perioperative FEN - soft diet VTE - SCDs, lovenox Foley - out Follow up - Dr. Dalbert Batman  Plan - Advance to soft diet. Patient will need a little more teaching for colostmoy care. Oncology consult pending. Likely ready for discharge in 1-2 days.   LOS: 7 days    Wellington Hampshire , [facility] 05/04/2017, 8:21 AM Pager: [phone] Consults: [phone] Mon-Fri 7:00 am-4:30 pm Sat-Sun 7:00 am-11:30 am

## 2017-05-04 NOTE — Discharge Instructions (Signed)
[facility]      [facility] °[phone] ° °OPEN ABDOMINAL SURGERY: POST OP INSTRUCTIONS ° °Always review your discharge instruction sheet given to you by the facility where your surgery was performed. ° °IF YOU HAVE DISABILITY OR FAMILY LEAVE FORMS, YOU MUST BRING THEM TO THE OFFICE FOR PROCESSING.  PLEASE DO NOT GIVE THEM TO YOUR DOCTOR. ° °1. A prescription for pain medication may be given to you upon discharge.  Take your pain medication as prescribed, if needed.  If narcotic pain medicine is not needed, then you may take acetaminophen (Tylenol) or ibuprofen (Advil) as needed. °2. Take your usually prescribed medications unless otherwise directed. °3. If you need a refill on your pain medication, please contact your pharmacy. They will contact our office to request authorization.  Prescriptions will not be filled after 5pm or on week-ends. °4. You should follow a light diet the first few days after arrival home, such as soup and crackers, pudding, etc.unless your doctor has advised otherwise. A high-fiber, low fat diet can be resumed as tolerated.   Be sure to include lots of fluids daily. Most patients will experience some swelling and bruising on the chest and neck area.  Ice packs will help.  Swelling and bruising can take several days to resolve °5. Most patients will experience some swelling and bruising in the area of the incision. Ice pack will help. Swelling and bruising can take several days to resolve..  °6. It is common to experience some constipation if taking pain medication after surgery.  Increasing fluid intake and taking a stool softener will usually help or prevent this problem from occurring.  A mild laxative (Milk of Magnesia or Miralax) should be taken according to package directions if there are no bowel movements after 48 hours. °7.  You may have steri-strips (small skin tapes) in place directly over the incision.  These strips should be left on the skin for 7-10 days.  If your  surgeon used skin glue on the incision, you may shower in 24 hours.  The glue will flake off over the next 2-3 weeks.  Any sutures or staples will be removed at the office during your follow-up visit. You may find that a light gauze bandage over your incision may keep your staples from being rubbed or pulled. You may shower and replace the bandage daily. °8. ACTIVITIES:  You may resume regular (light) daily activities beginning the next day--such as daily self-care, walking, climbing stairs--gradually increasing activities as tolerated.  You may have sexual intercourse when it is comfortable.  Refrain from any heavy lifting or straining until approved by your doctor. °a. You may drive when you no longer are taking prescription pain medication, you can comfortably wear a seatbelt, and you can safely maneuver your car and apply brakes °b. Return to Work: ___________________________________ °9. You should see your doctor in the office for a follow-up appointment approximately two weeks after your surgery.  Make sure that you call for this appointment within a day or two after you arrive home to insure a convenient appointment time. °OTHER INSTRUCTIONS:  °_____________________________________________________________ °_____________________________________________________________ ° °WHEN TO CALL YOUR DOCTOR: °1. Fever over 101.0 °2. Inability to urinate °3. Nausea and/or vomiting °4. Extreme swelling or bruising °5. Continued bleeding from incision. °6. Increased pain, redness, or drainage from the incision. °7. Difficulty swallowing or breathing °8. Muscle cramping or spasms. °9. Numbness or tingling in hands or feet or around lips. ° °The clinic staff is available to   answer your questions during regular business hours.  Please don’t hesitate to call and ask to speak to one of the nurses if you have concerns. ° °For further questions, please visit [email or web address] ° ° °Colostomy, Adult, Care After °Refer to  this sheet in the next few weeks. These instructions provide you with information about caring for yourself after your procedure. Your health care provider may also give you more specific instructions. Your treatment has been planned according to current medical practices, but problems sometimes occur. Call your health care provider if you have any problems or questions after your procedure. °What can I expect after the procedure? °After the procedure, it is common to have: °· Swelling at the opening that was created during the procedure (stoma). °· Slight bleeding around the stoma. °· Redness around the stoma. ° °Follow these instructions at home: °Activity °· Rest as needed while the stoma area heals. °· Return to your normal activities as told by your health care provider. Ask your health care provider what activities are safe for you. °· Avoid strenuous activity and abdominal exercises for 3 weeks or for as long as told by your health care provider. °· Do not lift anything that is heavier than 10 lb (4.5 kg). °Incision care ° °· Follow instructions from your health care provider about how to take care of your incision. Make sure you: °? Wash your hands with soap and water before you change your bandage (dressing). If soap and water are not available, use hand sanitizer. °? Change your dressing as told by your health care provider. °? Leave stitches (sutures), skin glue, or adhesive strips in place. These skin closures may need to stay in place for 2 weeks or longer. If adhesive strip edges start to loosen and curl up, you may trim the loose edges. Do not remove adhesive strips completely unless your health care provider tells you to do that. °Stoma Care °· Keep the stoma area clean. °· Clean and dry the skin around the stoma each time you change the colostomy bag. To clean the stoma area: °? Use warm water and only use cleansers that are recommended by your health care provider. °? Rinse the stoma area with  plain water. °? Dry the area well. °· Use stoma powder or ointment on your skin only as told by your health care provider. Do not use any other powders, gels, wipes, or creams on your skin. °· Check the stoma area every day for signs of infection. Check for: °? More redness, swelling, or pain. °? More fluid or blood. °? Pus or warmth. °· Measure the stoma opening regularly and record the size. Watch for changes. Share this information with your health care provider. °Bathing °· Do not take baths, swim, or use a hot tub until your health care provider approves. Ask your health care provider if you can take showers. You may be able to shower with or without the colostomy bag in place. If you bathe with the bag on, dry the bag afterward. °· Avoid using harsh or oily soaps when you bathe. °Colostomy Bag Care °· Follow instructions from your health care provider about how to empty or change the colostomy bag. °· Keep colostomy supplies with you at all times. °· Store all supplies in a cool, dry place. °· Empty the colostomy bag: °? Whenever it is one-third to one-half full. °? At bedtime. °· Replace the bag every 2-4 days or as told by your health care provider. °Driving °·   Do not drive for 24 hours if you received a sedative. °· Do not drive or operate heavy machinery while taking prescription pain medicine. °General instructions °· Follow instructions from your health care provider about eating or drinking restrictions. °· Take over-the-counter and prescription medicines only as told by your health care provider. °· Avoid wearing clothes that are tight directly over your stoma. °· Do not use any tobacco products, such as cigarettes, chewing tobacco, and e-cigarettes. If you need help quitting, ask your health care provider. °· (Women) Ask your health care provider about becoming pregnant and about using birth control. Medicines may not be absorbed normally after the procedure. °· Keep all follow-up visits as told by  your health care provider. This is important. °Contact a health care provider if: °· You are having trouble caring for your stoma or changing the colostomy bag. °· You feel nauseous or you vomit. °· You have a fever. °· You have more redness, swelling, or pain at the site of your stoma or around your anus. °· You have more fluid or blood coming from your stoma or your anus. °· Your stoma area feels warm to the touch. °· You have pus coming from your stoma. °· You notice a change in the size or appearance of the stoma. °· You have abdominal pain, bloating, pressure, or cramping. °· Your have stool more often or less often than your health care provider tells you to expect. °· You are not making much urine. This may be a sign of dehydration. °Get help right away if: °· Your abdominal pain does not go away or it becomes severe. °· You keep vomiting. °· Your stool is not draining through the stoma. °· You have chest pain or an irregular heartbeat. °This information is not intended to replace advice given to you by your health care provider. Make sure you discuss any questions you have with your health care provider. °Document Released: 08/20/2010 Document Revised: 08/08/2015 Document Reviewed: 12/11/2014 °Elsevier Interactive Patient Education © 2018 [employer] ° ° °

## 2017-05-05 MED ORDER — OXYCODONE HCL 5 MG PO TABS: 5.0000 mg | ORAL_TABLET | Freq: Four times a day (QID) | ORAL | 0 refills | Status: DC | PRN

## 2017-05-05 MED ORDER — CARVEDILOL 3.125 MG PO TABS: 3.1250 mg | ORAL_TABLET | Freq: Two times a day (BID) | ORAL | 0 refills | Status: AC

## 2017-05-05 NOTE — Progress Notes (Signed)
[facility] Progress Note  6 Days Post-Op  Subjective: CC-  Patient states that he feels great this morning. Tolerating regular diet. Denies n/v. Continues to have output from ostomy. He is not taking any narcotic pain medication.  Objective: Vital signs in last 24 hours: Temp:  [97.9 F (36.6 C)-99.8 F (37.7 C)] 97.9 F (36.6 C) (01/23 0634) Pulse Rate:  [76-83] 80 (01/23 0634) Resp:  [20] 20 (01/23 0634) BP: (160-162)/(79-96) 162/96 (01/23 0634) SpO2:  [99 %-100 %] 100 % (01/23 0634) Last BM Date: 05/04/17  Intake/Output from previous day: 01/22 0701 - 01/23 0700 In: 963 [P.O.:960; I.V.:3] Out: 100 [Stool:100] Intake/Output this shift: No intake/output data recorded.  PE: Gen: Alert, NAD, pleasant HEENT: EOM's intact, pupils equal and round Card: RRR, no M/G/R heard Pulm: CTAB, no W/R/R, effort normal Abd: Soft,mild distension,+BS,midlineincision C/D/Iwith staples intact and no erythema or drainage, LLQ ostomy pink with air and stool in bag Psych: A&Ox3  Skin: no rashes noted, warm and dry  Lab Results:  No results for input(s): WBC, HGB, HCT, PLT in the last 72 hours. BMET No results for input(s): NA, K, CL, CO2, GLUCOSE, BUN, CREATININE, CALCIUM in the last 72 hours. PT/INR No results for input(s): LABPROT, INR in the last 72 hours. CMP     Component Value Date/Time   NA 135 05/02/2017 0413   K 3.8 05/02/2017 0413   CL 104 05/02/2017 0413   CO2 26 05/02/2017 0413   GLUCOSE 125 (H) 05/02/2017 0413   BUN 15 05/02/2017 0413   CREATININE 1.07 05/02/2017 0413   CALCIUM 8.3 (L) 05/02/2017 0413   PROT 6.7 04/28/2017 0449   ALBUMIN 3.4 (L) 04/28/2017 0449   AST 15 04/28/2017 0449   ALT 13 (L) 04/28/2017 0449   ALKPHOS 76 04/28/2017 0449   BILITOT 0.5 04/28/2017 0449   GFRNONAA >60 05/02/2017 0413   GFRAA >60 05/02/2017 0413   Lipase     Component Value Date/Time   LIPASE 23 04/26/2017 1337       Studies/Results: Ct Chest W  Contrast  Result Date: 05/05/2017 CLINICAL DATA:  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 Chest staging. EXAM: CT CHEST WITH CONTRAST TECHNIQUE: Multidetector CT imaging of the chest was performed during intravenous contrast administration. CONTRAST:  42mL ISOVUE-300 IOPAMIDOL (ISOVUE-300) INJECTION 61% COMPARISON:  04/27/2017 CT abdomen/pelvis. FINDINGS: Cardiovascular: Normal heart size. No significant pericardial fluid/thickening. Great vessels are normal in course and caliber. No central pulmonary emboli. Mediastinum/Nodes: No discrete thyroid nodules. Unremarkable esophagus. No pathologically enlarged axillary, mediastinal or hilar lymph nodes. Lungs/Pleura: No pneumothorax. No pleural effusion. No acute consolidative airspace disease or lung masses. Two solid pulmonary nodules measuring 4 mm in the right lower lobe (series 5/image 73) and 2 mm in the left lower lobe (series 5/image 90). No additional significant pulmonary nodules. Upper abdomen: Redemonstration of indeterminate hypodense 1.1 cm posterior right liver lobe lesion (series 2/image 120). Redemonstration of subcentimeter hypodense anterior liver lesion (series 2/image 133), too small to characterize. Musculoskeletal: No aggressive appearing focal osseous lesions. Mild thoracic spondylosis. IMPRESSION: 1. Two tiny solid pulmonary nodules in the lower lobes, largest 4 mm, for which follow-up chest CT is advised in 3 months. 2. No thoracic adenopathy or other potential findings of metastatic disease in the chest. 3. Redemonstration of indeterminate small low-attenuation liver lesions, for which MRI abdomen without and with IV contrast is recommended for further characterization. Electronically Signed   By: Janina Mayo.D.  On: 05/05/2017 08:56    Anti-infectives: Anti-infectives (From admission, onward)   Start     Dose/Rate Route Frequency Ordered Stop   04/30/17 0000  cefoTEtan (CEFOTAN) 2 g in  dextrose 5 % 50 mL IVPB     2 g 100 mL/hr over 30 Minutes Intravenous Every 12 hours 04/29/17 1555 04/30/17 0058   04/29/17 1315  cefoTEtan (CEFOTAN) 2 g in dextrose 5 % 50 mL IVPB     2 g 100 mL/hr over 30 Minutes Intravenous On call to O.R. 04/28/17 1942 04/29/17 1212   04/29/17 0916  cefoTEtan in Dextrose 5% (CEFOTAN) 2-2.08 GM-%(50ML) IVPB    Comments:  Bridget Hartshorn   : cabinet override      04/29/17 0916 04/29/17 2129   04/29/17 0600  cefoTEtan (CEFOTAN) 2 g in dextrose 5 % 50 mL IVPB  Status:  Discontinued     2 g 100 mL/hr over 30 Minutes Intravenous On call to O.R. 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 SEVEN BENIGN LYMPH NODES (0/7) - CEA 2.7 (1/21) - tolerating diet, pain controlled, ostomy functioning  ID -cefotetan perioperative FEN -soft diet VTE -SCDs, lovenox Foley -out Follow up -Dr. Dalbert Batman  Plan - Patient ready for discharge from surgical standpoint. He plans to see PCP at home in 1 week for staple removal. He will return to [place] in 2-3 weeks to follow up with Dr. Dalbert Batman and oncology. Follow up and discharge info on AVS.   LOS: 8 days    Wellington Hampshire , [facility] 05/05/2017, 10:02 AM Pager: [phone] Consults: [phone] Mon-Fri 7:00 am-4:30 pm Sat-Sun 7:00 am-11:30 am

## 2017-05-05 NOTE — Discharge Summary (Signed)
Physician Discharge Summary  Adeoluwa Silvers [facility] ZOX:[id number] DOB: 07/24/1960 DOA: 04/27/2017  PCP: System, Pcp Not In  Admit date: 04/27/2017 Discharge date: 05/05/2017  Admitted From: home Disposition:  home Recommendations for Outpatient Follow-up:  1. Follow up with PCP in 1-2 weeks 2. Please obtain BMP/CBC in one week 3. Please follow up on the following pending results:ct chest  Home Health: None Equipment/Devices: None  discharge Condition stable CODE STATUS full code Diet recommendation regular diet Brief/Interim Summary: 57 year old male with no significant past medical history admitted with abdominal pain nausea. Patient noticed a change in his bowel habits with frequent loose bowels. He does complain of increasing mid abdominal pain. He was found to have proximal sigmoid colon circumferential narrowing. CT scan of the abdomenPersistent circumferential narrowing of the proximal sigmoid colon uspicious for malignancy (as versus non malignant stricture/focal colitis). This is causing high-grade partial obstruction of proximal colon and small bowel. Increase in amount of free fluid within the pelvis.  Foley catheter with decompression of urinary bladder.  Medullary sponge kidneys.  Previously noted indeterminate liver and right renal lesion not as well delineated on the present exam. Please see prior CT report recommendation. Patient seen in consult by surgery. GI consult placed today 04/28/2017 patient resting in bed with NG tube in place  . He does complain of abdominal pain.  04/29/2017 patient is scheduled to undergo surgery today. He is in good spirits today. Patient had flexible sigmoidoscopy yesterday. 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 Completely obstructing tumor in the distal sigmoid colon was noted. Patient is originally from [place] he is here for work-related issues. He has a wife and 2 children who are not with him at this  time.  1/18-patient resting in bed.Complains of some abdominal pain has gas in the colostomy bag. Has NG tube still in place.  1/19-patient sitting up in chair. NG tube has been DC'd yesterday. Foley has been DC'd yesterday. Patient reports no nausea vomiting. Does have some gas in the colostomy bag. He has been on ice chips and IV fluids.  05/02/2017 patient up in his room ambulating. Does have flatus in his colostomy bag denies any nausea still on IV fluids n.p.o.  1/21-patient ambulating in hall way.no stool yet.tolerating clears.has gas in the bag.   1/22-patient ambulating all over the hallway.  Has had some stool in the colostomy bag.  Tolerating p.o. intake.   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  No marginal involvement.  Appreciate oncology input.  CT scan of the chest was ordered for staging purposes which is pending at this time. 2]acute kidney injurystable with hydration. 3]hyponatremiastable. 4]hypertensionbp high persistently.will start coreg.    Discharge Instructions  Discharge Instructions    Call MD for:  severe uncontrolled pain   Complete by:  As directed    Call MD for:  temperature >100.4   Complete by:  As directed    Diet - low sodium heart healthy   Complete by:  As directed    Discharge instructions   Complete by:  As directed    Follow up with pcp dr Dalbert Batman and dr Lebron Conners  Increase activity slowly   Complete by:  As directed      Allergies as of 05/05/2017   No  Known Allergies     Medication List    STOP taking these medications   ciprofloxacin 500 MG tablet Commonly known as:  CIPRO   dicyclomine 20 MG tablet Commonly known as:  BENTYL   metroNIDAZOLE 500 MG tablet Commonly known as:  FLAGYL   ondansetron 4 MG disintegrating tablet Commonly known as:  ZOFRAN-ODT     TAKE these medications   carvedilol 3.125 MG tablet Commonly known as:  COREG Take 1 tablet (3.125 mg total) by mouth 2 (two) times daily with a meal.   ONE-A-DAY MENS PO Take 1 tablet by mouth daily.   oxyCODONE 5 MG immediate release tablet Commonly known as:  Oxy IR/ROXICODONE Take 1-2 tablets (5-10 mg total) by mouth every 6 (six) hours as needed for moderate pain, severe pain or breakthrough pain.   VITAMIN C PO Take 1 tablet by mouth daily.      Follow-up Information    Fanny Skates, MD. Go on 05/27/2017.   Specialty:  General Surgery Why:  Your appointment is 05/27/2017 at 9:45AM. Please arrive 30 minutes prior to your appointment to check in and fill out paperwork. Bring photo ID and insurance information. Contact information: [place] [address] [phone]        Primary care physician. Call in 1 week(s).   Why:  Please contact your primary care physician when you get home. You will need to have your staples removed on 05/12/17 or 05/13/17       Lebron Conners, Marinell Blight, MD. Call in 3 week(s).   Specialty:  Hematology and Oncology Contact information: [place] [address] [phone]          No Known Allergies  Consultations:surg,onc   Procedures/Studies: Ct Abdomen Pelvis Wo Contrast  Result Date: 04/27/2017 CLINICAL DATA:  57 year old male with suprapubic pain for 2 days. Subsequent encounter. EXAM: CT ABDOMEN AND PELVIS WITHOUT CONTRAST TECHNIQUE: Multidetector CT imaging of the abdomen and pelvis was performed following the standard protocol without IV contrast. COMPARISON:  04/26/2017 CT.  FINDINGS: Lower chest: Basilar subsegmental atelectasis. Heart size within normal limits. Hepatobiliary: 11 mm posterior right lobe of indeterminate hypodense lesion. Vicarious excretion of contrast in the gallbladder. Pancreas: Taking into account limitation by non contrast imaging, no pancreatic mass or inflammation. Spleen: Taking into account limitation by non contrast imaging, no splenic mass or enlargement. Adrenals/Urinary Tract: Dense pyramids bilaterally suggestive of medullary sponge disease without obstructing stone or hydronephrosis. 1.7 cm indeterminate lesion anterior aspect midportion right kidney better delineated on prior CT. Urinary bladder decompressed with Foley catheter. No adrenal mass. Stomach/Bowel: Persistent circumferential narrowing of the proximal sigmoid colon. This does not cause complete obstruction as gas has traversed beyond this region however, proximal large and small bowel dilated consistent with high-grade partial obstruction. Vascular/Lymphatic: No aortic aneurysm.  No adenopathy. Reproductive: Prostate gland unremarkable. Other: Increase amount of free fluid most prominent pelvic region. No free intraperitoneal air. No bowel containing hernia. Musculoskeletal: Coarse calcifications anterior to left pubic symphysis may be related prior trauma. IMPRESSION: Persistent circumferential narrowing of the proximal sigmoid colon suspicious for malignancy (as versus non malignant stricture/focal colitis). This is causing high-grade partial obstruction of proximal colon and small bowel. Increase in amount of free fluid within the pelvis. Foley catheter with decompression of urinary bladder. Medullary sponge kidneys. Previously noted indeterminate liver  and right renal lesion not as well delineated on the present exam. Please see prior CT report recommendation. These results were called by telephone at the time of interpretation on 04/27/2017 at 3:05 pm to Dr. Theotis Burrow , who verbally  acknowledged these results. Electronically Signed   By: Genia Del M.D.   On: 04/27/2017 15:08   Ct Abdomen Pelvis W Contrast  Addendum Date: 04/26/2017   ADDENDUM REPORT: 04/26/2017 17:15 ADDENDUM: After review of the images, there is possible focal narrowing at the sigmoid colon, series 2, image number 45 with colon upstream to this filled with gas and liquid stools. Findings could be secondary to spasm within the colon or possible small focal area of colitis. Follow-up examination with oral contrast could be helpful if there is high suspicion for obstructive process. Electronically Signed   By: Donavan Foil M.D.   On: 04/26/2017 17:15   Result Date: 04/26/2017 CLINICAL DATA:  Pain across the entire abdomen at the umbilicus with nausea and vomiting EXAM: CT ABDOMEN AND PELVIS WITH CONTRAST TECHNIQUE: Multidetector CT imaging of the abdomen and pelvis was performed using the standard protocol following bolus administration of intravenous contrast. CONTRAST:  18mL ISOVUE-300 IOPAMIDOL (ISOVUE-300) INJECTION 61% COMPARISON:  None. FINDINGS: Lower chest: Lung bases demonstrate no acute consolidation or pleural effusion. Normal heart size. Hepatobiliary: Subcentimeter hypodensity anterior aspect of the liver too small to further characterize. 11 mm intermediate density lesion posterior right hepatic lobe. No calcified gallstones or biliary dilatation Pancreas: Unremarkable. No pancreatic ductal dilatation or surrounding inflammatory changes. Spleen: Normal in size without focal abnormality. Adrenals/Urinary Tract: Adrenal glands are within normal limits. No hydronephrosis. 17 mm intermediate density lesion mid pole right kidney. Distended urinary bladder Stomach/Bowel: Stomach nonenlarged. Diffuse fluid-filled small and large bowel. Negative appendix. No colon wall thickening. Vascular/Lymphatic: No significant vascular findings are present. No enlarged abdominal or pelvic lymph nodes. Reproductive: Prostate  is unremarkable. Other: Negative for free air. Tiny amount of fluid in the right lower quadrant. Musculoskeletal: Coarse dystrophic appearing calcification anterior to the left pubic bone, could relate to prior trauma or hematoma. No acute or suspicious lesion IMPRESSION: 1. Negative for bowel obstruction, appendicitis, or bowel wall thickening. Scattered fluid-filled loops of small bowel with mild gaseous and fluid enlargement of the colon, query enteritis 2. Tiny amount of free fluid in the right lower quadrant 3. Indeterminate hypodense lesions in the right hepatic lobe and right kidney. When the patient is clinically stable and able to follow directions and hold their breath (preferably as an outpatient) further evaluation with dedicated abdominal MRI should be considered. Electronically Signed: By: Donavan Foil M.D. On: 04/26/2017 15:50    (Echo, Carotid, EGD, Colonoscopy, ERCP)    Subjective:   Discharge Exam: Vitals:   05/04/17 2218 05/05/17 0634  BP: (!) 160/79 (!) 162/96  Pulse: 83 80  Resp: 20 20  Temp: 99.8 F (37.7 C) 97.9 F (36.6 C)  SpO2: 99% 100%   Vitals:   05/04/17 0551 05/04/17 1357 05/04/17 2218 05/05/17 0634  BP: 140/84 (!) 161/88 (!) 160/79 (!) 162/96  Pulse: 81 76 83 80  Resp: 20 20 20 20   Temp: 98.6 F (37 C) 98.4 F (36.9 C) 99.8 F (37.7 C) 97.9 F (36.6 C)  TempSrc: Oral Oral Oral Oral  SpO2: 99% 99% 99% 100%  Weight:      Height:        General: Pt is alert, awake, not in acute distress Cardiovascular: RRR, S1/S2 +, no rubs, no  gallops Respiratory: CTA bilaterally, no wheezing, no rhonchi Abdominal: Soft, NT, ND, bowel sounds + Extremities: no edema, no cyanosis    The results of significant diagnostics from this hospitalization (including imaging, microbiology, ancillary and laboratory) are listed below for reference.     Microbiology: Recent Results (from the past 240 hour(s))  MRSA PCR Screening     Status: None   Collection Time:  04/29/17  8:50 AM  Result Value Ref Range Status   MRSA by PCR NEGATIVE NEGATIVE Final    Comment:        The GeneXpert MRSA Assay (FDA approved for NASAL specimens only), is one component of a comprehensive MRSA colonization surveillance program. It is not intended to diagnose MRSA infection nor to guide or monitor treatment for MRSA infections.      Labs: BNP (last 3 results) No results for input(s): BNP in the last 8760 hours. Basic Metabolic Panel: Recent Labs  Lab 04/29/17 0501 04/30/17 0451 05/01/17 0856 05/02/17 0413  NA 138 137 134* 135  K 3.9 4.7 3.8 3.8  CL 104 104 99* 104  CO2 26 28 27 26   GLUCOSE 88 124* 117* 125*  BUN 14 19 16 15   CREATININE 1.13 1.27* 1.16 1.07  CALCIUM 8.5* 8.2* 8.7* 8.3*  MG  --   --   --  2.1   Liver Function Tests: No results for input(s): AST, ALT, ALKPHOS, BILITOT, PROT, ALBUMIN in the last 168 hours. No results for input(s): LIPASE, AMYLASE in the last 168 hours. No results for input(s): AMMONIA in the last 168 hours. CBC: Recent Labs  Lab 04/28/17 2355 04/30/17 0451 05/01/17 0856  WBC 10.5 9.9 11.1*  HGB 11.3* 9.8* 9.7*  HCT 34.3* 29.5* 29.8*  MCV 94.0 92.8 94.0  PLT 354 313 320   Cardiac Enzymes: No results for input(s): CKTOTAL, CKMB, CKMBINDEX, TROPONINI in the last 168 hours. BNP: Invalid input(s): POCBNP CBG: Recent Labs  Lab 04/30/17 1728 04/30/17 1958 04/30/17 2350 05/01/17 0359 05/01/17 0801  GLUCAP 87 98 95 92 105*   D-Dimer No results for input(s): DDIMER in the last 72 hours. Hgb A1c No results for input(s): HGBA1C in the last 72 hours. Lipid Profile No results for input(s): CHOL, HDL, LDLCALC, TRIG, CHOLHDL, LDLDIRECT in the last 72 hours. Thyroid function studies No results for input(s): TSH, T4TOTAL, T3FREE, THYROIDAB in the last 72 hours.  Invalid input(s): FREET3 Anemia work up No results for input(s): VITAMINB12, FOLATE, FERRITIN, TIBC, IRON, RETICCTPCT in the last 72  hours. Urinalysis    Component Value Date/Time   COLORURINE YELLOW 04/26/2017 [place] 04/26/2017 1645   LABSPEC 1.010 04/26/2017 1645   PHURINE 6.0 04/26/2017 1645   GLUCOSEU NEGATIVE 04/26/2017 1645   HGBUR SMALL (A) 04/26/2017 1645   BILIRUBINUR NEGATIVE 04/26/2017 1645   KETONESUR 15 (A) 04/26/2017 1645   PROTEINUR NEGATIVE 04/26/2017 1645   NITRITE NEGATIVE 04/26/2017 1645   LEUKOCYTESUR NEGATIVE 04/26/2017 1645   Sepsis Labs Invalid input(s): PROCALCITONIN,  WBC,  LACTICIDVEN Microbiology Recent Results (from the past 240 hour(s))  MRSA PCR Screening     Status: None   Collection Time: 04/29/17  8:50 AM  Result Value Ref Range Status   MRSA by PCR NEGATIVE NEGATIVE Final    Comment:        The GeneXpert MRSA Assay (FDA approved for NASAL specimens only), is one component of a comprehensive MRSA colonization surveillance program. It is not intended to diagnose MRSA infection nor to guide or  monitor treatment for MRSA infections.      Time coordinating discharge: Over 30 minutes  SIGNED:   Georgette Shell, MD  [facility] 05/05/2017, 7:53 AM If 7PM-7AM, please contact night-coverage [email or web address] Password [email or web address]

## 2017-05-06 ENCOUNTER — Telehealth: Payer: Self-pay | Admitting: Hematology and Oncology

## 2017-05-06 ENCOUNTER — Encounter: Payer: Self-pay | Admitting: Hematology and Oncology

## 2017-05-06 NOTE — Telephone Encounter (Signed)
Hospital follow up appt has been scheduled for the pt to see Dr. Lebron Conners on 2/13 at 1pm. Pt aware to arrive 30 minutes early. Letter mailed to the pt.

## 2017-05-26 ENCOUNTER — Telehealth: Payer: Self-pay

## 2017-05-26 ENCOUNTER — Inpatient Hospital Stay: Payer: BLUE CROSS/BLUE SHIELD | Admitting: Hematology and Oncology

## 2017-05-26 NOTE — Telephone Encounter (Signed)
A phone call was made regarding patients missed appointment at 1300.  Patient stated he called two days ago and left a message stating he would need to cancel due to the fact he was being treated in [place].  Our fax number was given so patient can fax results from his treatment in [place].

## 2017-08-05 ENCOUNTER — Encounter: Payer: Self-pay | Admitting: General Surgery

## 2017-08-09 ENCOUNTER — Other Ambulatory Visit: Payer: Self-pay | Admitting: General Surgery

## 2017-08-16 ENCOUNTER — Encounter: Payer: Self-pay | Admitting: Gastroenterology

## 2017-09-12 NOTE — H&P (Signed)
Harry Avila Location: [facility] Patient #: [id number] DOB: March 19, 1961 Married / Language: English / Race: Black or African American Male         History of Present Illness      The patient is a 57 year old male who presents with a complaint of preoperative colostomy closure. This is a healthy 57 year old gentleman. 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      He presented with complete obstruction of the sigmoid colon secondary to malignancy in January. Flexible sigmoidoscopy found a high-grade neoplastic mass at 30 cm. He very quickly was taken to the operating room and underwent laparotomy, sigmoid colectomy and end colostomy. I could palpate a mass in the distal sigmoid colon. I transected the rectosigmoid colon several inches below the tumor and marked the rectal stump with Prolene. Final pathology showed a 5 cm cancer, negative margins, only 7 nodes were examined and they were negative. Pathologic stage T4a, N0. He recovered uneventfully.     Inpatient oncology consult with Dr. Lebron Conners recommended systemic chemotherapy. The patient is now established with an oncologist, Valora Corporal [facility], phone number [phone]. He has recommended Xeloda but the patient has been reluctant to undergo this. Recent PET scans and CT scans were reportedly normal per patient. I've asked him to fax that to me. He recently had a completion colonoscopy per rectum and per ostomy. Rectum was clear with mucous only. The proximal colon had some small adenomas. We have that report. The patient states he was told they were all completely benign. I have requested that pathology report.      He states he has an appointment to see another oncologist for a second opinion on May 8.      The patient is feeling well and wants to have his colostomy closed in late May or early June. I referred him to Dr. Marcello Moores for consideration of  robotic colostomy closure. She stated that she would do this in July but would not do it sooner. I have agreed to close the colostomy of the end of May to accommodate his scheduling desires. He will be scheduled for laparoscopic assisted resection and closure of his colostomy, possible open colostomy closure. This may require splenic flexure takedown. Adhesions may or may not be bad. We will do this in lithotomy and try to staple the anastomosis. He has been given mechanical and antibiotic bowel prep and also instructed to give himself rectal enemas to clear out the rectal stump I discussed the indications, details, techniques, and numerous risk of the surgery with him. He is aware of the risk of bleeding, infection, temporary diverting loop ileostomy, wound healing problems such as hernia or dehiscence, anastomotic leak requiring reoperation and diversion, injury to adjacent organs with major reconstructive surgery. If he is going to agree to any adjuvant therapy it will be at least 6 weeks postop and longer if he has a complication. He understands all these issues. All of his questions are answered. He agrees with this plan.  Orders entered. Colorectal ERAS.    Physical Exam  General Mental Status-Alert. General Appearance-Consistent with stated age. Hydration-Well hydrated. Voice-Normal.  Head and Neck Head-normocephalic, atraumatic with no lesions or palpable masses. Trachea-midline. Thyroid Gland Characteristics - normal size and consistency.  Eye Eyeball - Bilateral-Extraocular movements intact. Sclera/Conjunctiva - Bilateral-No scleral icterus.  Chest and Lung Exam Chest and lung exam reveals -quiet, even and easy respiratory effort with no  use of accessory muscles and on auscultation, normal breath sounds, no adventitious sounds and normal vocal resonance. Inspection Chest Wall - Normal. Back - normal.  Cardiovascular Cardiovascular examination  reveals -normal heart sounds, regular rate and rhythm with no murmurs and normal pedal pulses bilaterally.  Abdomen Note: Scaphoid. Muscular. Nondistended. Midline incision healed without hernia. Healthy ostomy left side stool in bag. No parastomal hernia   Neurologic Neurologic evaluation reveals -alert and oriented x 3 with no impairment of recent or remote memory. Mental Status-Normal.  Musculoskeletal Normal Exam - Left-Upper Extremity Strength Normal and Lower Extremity Strength Normal. Normal Exam - Right-Upper Extremity Strength Normal and Lower Extremity Strength Normal.  Lymphatic Head & Neck  General Head & Neck Lymphatics: Bilateral - Description - Normal. Axillary  General Axillary Region: Bilateral - Description - Normal. Tenderness - Non Tender. Femoral & Inguinal  Generalized Femoral & Inguinal Lymphatics: Bilateral - Description - Normal. Tenderness - Non Tender.    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 They found some benign polyps and they have told you that they are all benign If you can fax that pathology report to me that would be great  you stated that you have had 2 or 3 CAT scans and 1 PET scan all of which are negative  you stated that you have not started the xeloda and that you're going to have a second opinion from another oncologist on May 8 Please fax me a copy of the oncologist's office notes  You have requested that we proceed with colostomy closure as soon as possible Dr. Marcello Moores wanted to wait until 6 months had gone by I have agreed to perform the surgery in late May  You need to be in [place] or locally for several days prior to the surgery and for at least a week to 10 days after the surgery You need to be on a clear liquid diet for a day and a half before the surgery I request that you give yourself two lukewarm Tap Water enemas per day for 3  days prior to the surgery to clean out the rectum you will be given a mechanical bowel prep and an antibiotic pill prep the day before surgery... That will clean out the upper colon through the colostomy you will be admitted to the hospital the next day after your surgery  We have discussed the indications, techniques, and numerous risk of this surgery.  Pt Education - [facility] Colon Bowel Prep 2018 ERAS/Miralax/Antibiotics Started Neomycin Sulfate 500 MG Oral Tablet, 2 (two) Tablet SEE NOTE, #6, 08/09/2017, No Refill. Local Order: TAKE TWO TABLETS AT 2 PM, 3 PM, AND 10 PM THE DAY PRIOR TO SURGERY Started Flagyl 500 MG Oral Tablet, 2 (two) Tablet SEE NOTE, #6, 08/09/2017, No Refill. Local Order: Take at 2pm, 3pm, and 10pm the day prior to your colon operation Pt Education - Pamphlet Given - Laparoscopic Colorectal Surgery: discussed with patient and provided information.  S/P COLECTOMY (Z90.49)    Edsel Petrin. Dalbert Batman, M.D., [facility] [place] [facility] General and Minimally invasive Surgery Breast and Colorectal Surgery Office:   [phone] Pager:   [phone]

## 2017-09-13 NOTE — Patient Instructions (Addendum)
[place]  09/13/2017   Your procedure is scheduled on: 09-15-17   Report to [facility] Main  Entrance               Report to admitting at     [place] AM    Call this number if you have problems the morning of surgery [phone]               FOLLOW A CLEAR LIQUID DIET THE DAY OF YOUR BOWEL PREP. FOLLOW BOWEL PREP PER MD   Remember: Do not eat food:After Midnight.   DRINK 2 PRESURGERY ENSURE DRINKS THE NIGHT BEFORE SURGERY AT  1000 PM AND 1 PRESURGERY DRINK THE DAY OF THE PROCEDURE 3 HOURS PRIOR TO SCHEDULED SURGERY. NO SOLIDS AFTER MIDNIGHT THE DAY PRIOR TO THE SURGERY. NOTHING BY MOUTH EXCEPT CLEAR LIQUIDS UNTIL THREE HOURS PRIOR TO SCHEDULED SURGERY. PLEASE FINISH PRESURGERY ENSURE DRINK PER SURGEON ORDER 3 HOURS PRIOR TO SCHEDULED SURGERY TIME WHICH NEEDS TO BE COMPLETED AT __ _0530 AM______.    CLEAR LIQUID DIET   Foods Allowed                                                                     Foods Excluded  Coffee and tea, regular and decaf                             liquids that you cannot  Plain Jell-O in any flavor                                             see through such as: Fruit ices (not with fruit pulp)                                     milk, soups, orange juice  Iced Popsicles                                    All solid food Carbonated beverages, regular and diet                                    Cranberry, grape and apple juices Sports drinks like Gatorade Lightly seasoned clear broth or consume(fat free) Sugar, honey syrup  Sample Menu Breakfast                                Lunch                                     Supper Cranberry juice  Beef broth                            Chicken broth Jell-O                                     Grape juice                           Apple juice Coffee or tea                        Jell-O                                      Popsicle                     Coffee or tea                        Coffee or tea  _____________________________________________________________________     Take these medicines the morning of surgery with A SIP OF WATER: none                                You may not have any metal on your body including hair pins and              piercings  Do not wear jewelry,  lotions, powders or perfumes, deodorant                     Men may shave face and neck.   Do not bring valuables to the hospital. [place].  Contacts, dentures or bridgework may not be worn into surgery.      Patients discharged the day of surgery will not be allowed to drive home.  Name and phone number of your driver:  Special Instructions: N/A              Please read over the following fact sheets you were given: _____________________________________________________________________             [facility] - Preparing for Surgery Before surgery, you can play an important role.  Because skin is not sterile, your skin needs to be as free of germs as possible.  You can reduce the number of germs on your skin by washing with CHG (chlorahexidine gluconate) soap before surgery.  CHG is an antiseptic cleaner which kills germs and bonds with the skin to continue killing germs even after washing. Please DO NOT use if you have an allergy to CHG or antibacterial soaps.  If your skin becomes reddened/irritated stop using the CHG and inform your nurse when you arrive at Short Stay. Do not shave (including legs and underarms) for at least 48 hours prior to the first CHG shower.  You may shave your face/neck. Please follow these instructions carefully:  1.  Shower with CHG Soap the night before surgery and the  morning of Surgery.  2.  If you choose to wash your hair, wash your hair first as  usual with your  normal  shampoo.  3.  After you shampoo, rinse your hair and body thoroughly to remove  the  shampoo.                           4.  Use CHG as you would any other liquid soap.  You can apply chg directly  to the skin and wash                       Gently with a scrungie or clean washcloth.  5.  Apply the CHG Soap to your body ONLY FROM THE NECK DOWN.   Do not use on face/ open                           Wound or open sores. Avoid contact with eyes, ears mouth and genitals (private parts).                       Wash face,  Genitals (private parts) with your normal soap.             6.  Wash thoroughly, paying special attention to the area where your surgery  will be performed.  7.  Thoroughly rinse your body with warm water from the neck down.  8.  DO NOT shower/wash with your normal soap after using and rinsing off  the CHG Soap.                9.  Pat yourself dry with a clean towel.            10.  Wear clean pajamas.            11.  Place clean sheets on your bed the night of your first shower and do not  sleep with pets. Day of Surgery : Do not apply any lotions/deodorants the morning of surgery.  Please wear clean clothes to the hospital/surgery center.  FAILURE TO FOLLOW THESE INSTRUCTIONS MAY RESULT IN THE CANCELLATION OF YOUR SURGERY PATIENT SIGNATURE_________________________________  NURSE SIGNATURE__________________________________  ________________________________________________________________________  WHAT IS A BLOOD TRANSFUSION? Blood Transfusion Information  A transfusion is the replacement of blood or some of its parts. Blood is made up of multiple cells which provide different functions.  Red blood cells carry oxygen and are used for blood loss replacement.  White blood cells fight against infection.  Platelets control bleeding.  Plasma helps clot blood.  Other blood products are available for specialized needs, such as hemophilia or other clotting disorders. BEFORE THE TRANSFUSION  Who gives blood for transfusions?   Healthy volunteers who are  fully evaluated to make sure their blood is safe. This is blood bank blood. Transfusion therapy is the safest it has ever been in the practice of medicine. Before blood is taken from a donor, a complete history is taken to make sure that person has no history of diseases nor engages in risky social behavior (examples are intravenous drug use or sexual activity with multiple partners). The donor's travel history is screened to minimize risk of transmitting infections, such as malaria. The donated blood is tested for signs of infectious diseases, such as HIV and hepatitis. The blood is then tested to be sure it is compatible with you in order to minimize the chance of a transfusion reaction. If you or a relative donates blood, this is often done  in anticipation of surgery and is not appropriate for emergency situations. It takes many days to process the donated blood. RISKS AND COMPLICATIONS Although transfusion therapy is very safe and saves many lives, the main dangers of transfusion include:   Getting an infectious disease.  Developing a transfusion reaction. This is an allergic reaction to something in the blood you were given. Every precaution is taken to prevent this. The decision to have a blood transfusion has been considered carefully by your caregiver before blood is given. Blood is not given unless the benefits outweigh the risks. AFTER THE TRANSFUSION  Right after receiving a blood transfusion, you will usually feel much better and more energetic. This is especially true if your red blood cells have gotten low (anemic). The transfusion raises the level of the red blood cells which carry oxygen, and this usually causes an energy increase.  The nurse administering the transfusion will monitor you carefully for complications. HOME CARE INSTRUCTIONS  No special instructions are needed after a transfusion. You may find your energy is better. Speak with your caregiver about any limitations on  activity for underlying diseases you may have. SEEK MEDICAL CARE IF:   Your condition is not improving after your transfusion.  You develop redness or irritation at the intravenous (IV) site. SEEK IMMEDIATE MEDICAL CARE IF:  Any of the following symptoms occur over the next 12 hours:  Shaking chills.  You have a temperature by mouth above 102 F (38.9 C), not controlled by medicine.  Chest, back, or muscle pain.  People around you feel you are not acting correctly or are confused.  Shortness of breath or difficulty breathing.  Dizziness and fainting.  You get a rash or develop hives.  You have a decrease in urine output.  Your urine turns a dark color or changes to pink, red, or brown. Any of the following symptoms occur over the next 10 days:  You have a temperature by mouth above 102 F (38.9 C), not controlled by medicine.  Shortness of breath.  Weakness after normal activity.  The white part of the eye turns yellow (jaundice).  You have a decrease in the amount of urine or are urinating less often.  Your urine turns a dark color or changes to pink, red, or brown. Document Released: 03/27/2000 Document Revised: 06/22/2011 Document Reviewed: 11/14/2007 [facility] Patient Information 2014 [place], [place].  _______________________________________________________________________

## 2017-09-14 ENCOUNTER — Other Ambulatory Visit: Payer: Self-pay

## 2017-09-14 ENCOUNTER — Encounter (HOSPITAL_COMMUNITY)
Admission: RE | Admit: 2017-09-14 | Discharge: 2017-09-14 | Disposition: A | Discharge status: Home / Self Care | Payer: BLUE CROSS/BLUE SHIELD | Source: Ambulatory Visit | Attending: General Surgery | Admitting: General Surgery

## 2017-09-14 ENCOUNTER — Encounter (HOSPITAL_COMMUNITY): Payer: Self-pay

## 2017-09-14 DIAGNOSIS — Z01812 Encounter for preprocedural laboratory examination: Secondary | ICD-10-CM

## 2017-09-14 LAB — CBC WITH DIFFERENTIAL/PLATELET
BASOS ABS: 0 10*3/uL (ref 0.0–0.1)
BASOS PCT: 0 %
EOS PCT: 2 %
Eosinophils Absolute: 0.1 10*3/uL (ref 0.0–0.7)
HEMATOCRIT: 40.6 % (ref 39.0–52.0)
Hemoglobin: 13.1 g/dL (ref 13.0–17.0)
Lymphocytes Relative: 30 %
Lymphs Abs: 1.5 10*3/uL (ref 0.7–4.0)
MCH: 30.3 pg (ref 26.0–34.0)
MCHC: 32.3 g/dL (ref 30.0–36.0)
MCV: 93.8 fL (ref 78.0–100.0)
Monocytes Absolute: 0.4 10*3/uL (ref 0.1–1.0)
Monocytes Relative: 8 %
NEUTROS ABS: 3 10*3/uL (ref 1.7–7.7)
Neutrophils Relative %: 60 %
PLATELETS: 252 10*3/uL (ref 150–400)
RBC: 4.33 MIL/uL (ref 4.22–5.81)
RDW: 14.9 % (ref 11.5–15.5)
WBC: 5.1 10*3/uL (ref 4.0–10.5)

## 2017-09-14 LAB — COMPREHENSIVE METABOLIC PANEL
ALBUMIN: 4.3 g/dL (ref 3.5–5.0)
ALT: 33 U/L (ref 17–63)
AST: 23 U/L (ref 15–41)
Alkaline Phosphatase: 54 U/L (ref 38–126)
Anion gap: 7 (ref 5–15)
BUN: 14 mg/dL (ref 6–20)
CHLORIDE: 106 mmol/L (ref 101–111)
CO2: 29 mmol/L (ref 22–32)
Calcium: 9.6 mg/dL (ref 8.9–10.3)
Creatinine, Ser: 1.36 mg/dL — ABNORMAL HIGH (ref 0.61–1.24)
GFR calc Af Amer: >60 mL/min (ref >60)
GFR calc non Af Amer: 58 mL/min — ABNORMAL LOW (ref >60)
GLUCOSE: 90 mg/dL (ref 65–99)
POTASSIUM: 4.6 mmol/L (ref 3.5–5.1)
SODIUM: 142 mmol/L (ref 135–145)
Total Bilirubin: 0.9 mg/dL (ref 0.3–1.2)
Total Protein: 7.4 g/dL (ref 6.5–8.1)

## 2017-09-14 LAB — ABO/RH: ABO/RH(D): O POS

## 2017-09-14 NOTE — Progress Notes (Signed)
Ct chest 05-05-17 epic

## 2017-09-15 ENCOUNTER — Inpatient Hospital Stay (HOSPITAL_COMMUNITY): Payer: BLUE CROSS/BLUE SHIELD | Admitting: Anesthesiology

## 2017-09-15 ENCOUNTER — Encounter (HOSPITAL_COMMUNITY): Payer: Self-pay | Admitting: *Deleted

## 2017-09-15 ENCOUNTER — Encounter (HOSPITAL_COMMUNITY)
Admission: RE | Disposition: A | Discharge status: Home / Self Care | Payer: Self-pay | Source: Home / Self Care | Attending: General Surgery

## 2017-09-15 ENCOUNTER — Inpatient Hospital Stay (HOSPITAL_COMMUNITY)
Admission: RE | Admit: 2017-09-15 | Discharge: 2017-09-20 | DRG: 330 | Disposition: A | Discharge status: Home / Self Care | Payer: BLUE CROSS/BLUE SHIELD | Attending: General Surgery | Admitting: General Surgery

## 2017-09-15 DIAGNOSIS — D62 Acute posthemorrhagic anemia: Secondary | ICD-10-CM | POA: Diagnosis not present

## 2017-09-15 DIAGNOSIS — C187 Malignant neoplasm of sigmoid colon: Secondary | ICD-10-CM | POA: Diagnosis present

## 2017-09-15 DIAGNOSIS — K660 Peritoneal adhesions (postprocedural) (postinfection): Secondary | ICD-10-CM | POA: Diagnosis present

## 2017-09-15 DIAGNOSIS — Z433 Encounter for attention to colostomy: Principal | ICD-10-CM

## 2017-09-15 DIAGNOSIS — Z933 Colostomy status: Secondary | ICD-10-CM

## 2017-09-15 DIAGNOSIS — K56609 Unspecified intestinal obstruction, unspecified as to partial versus complete obstruction: Secondary | ICD-10-CM

## 2017-09-15 HISTORY — PX: PROCTOSCOPY: SHX2266

## 2017-09-15 HISTORY — PX: COLOSTOMY: SHX63

## 2017-09-15 LAB — CBC
HEMATOCRIT: 38 % — AB (ref 39.0–52.0)
Hemoglobin: 12.4 g/dL — ABNORMAL LOW (ref 13.0–17.0)
MCH: 30.5 pg (ref 26.0–34.0)
MCHC: 32.6 g/dL (ref 30.0–36.0)
MCV: 93.6 fL (ref 78.0–100.0)
Platelets: 244 10*3/uL (ref 150–400)
RBC: 4.06 MIL/uL — ABNORMAL LOW (ref 4.22–5.81)
RDW: 14.7 % (ref 11.5–15.5)
WBC: 12.3 10*3/uL — AB (ref 4.0–10.5)

## 2017-09-15 LAB — BASIC METABOLIC PANEL
ANION GAP: 9 (ref 5–15)
BUN: 10 mg/dL (ref 6–20)
CHLORIDE: 104 mmol/L (ref 101–111)
CO2: 24 mmol/L (ref 22–32)
Calcium: 8.3 mg/dL — ABNORMAL LOW (ref 8.9–10.3)
Creatinine, Ser: 1.51 mg/dL — ABNORMAL HIGH (ref 0.61–1.24)
GFR calc Af Amer: 59 mL/min — ABNORMAL LOW (ref >60)
GFR, EST NON AFRICAN AMERICAN: 51 mL/min — AB (ref >60)
GLUCOSE: 209 mg/dL — AB (ref 65–99)
Potassium: 3.7 mmol/L (ref 3.5–5.1)
Sodium: 137 mmol/L (ref 135–145)

## 2017-09-15 LAB — TYPE AND SCREEN
ABO/RH(D): O POS
Antibody Screen: NEGATIVE

## 2017-09-15 LAB — CEA: CEA1: 3.7 ng/mL (ref 0.0–4.7)

## 2017-09-15 SURGERY — CREATION, COLOSTOMY
Anesthesia: General | Site: Abdomen

## 2017-09-15 MED ORDER — HYDROMORPHONE HCL 1 MG/ML IJ SOLN: 1.0000 mg | INTRAMUSCULAR | Status: DC | PRN

## 2017-09-15 MED ORDER — ACETAMINOPHEN 500 MG PO TABS
1000.0000 mg | ORAL_TABLET | ORAL | Status: AC
  Administered 2017-09-15: 1000 mg via ORAL
  Filled 2017-09-15: qty 2

## 2017-09-15 MED ORDER — FENTANYL CITRATE (PF) 250 MCG/5ML IJ SOLN
INTRAMUSCULAR | Status: AC
  Filled 2017-09-15: qty 5

## 2017-09-15 MED ORDER — FENTANYL CITRATE (PF) 100 MCG/2ML IJ SOLN
25.0000 ug | INTRAMUSCULAR | Status: DC | PRN
  Administered 2017-09-15 (×2): 50 ug via INTRAVENOUS

## 2017-09-15 MED ORDER — GABAPENTIN 300 MG PO CAPS
300.0000 mg | ORAL_CAPSULE | ORAL | Status: AC
  Administered 2017-09-15: 300 mg via ORAL
  Filled 2017-09-15: qty 1

## 2017-09-15 MED ORDER — FENTANYL CITRATE (PF) 100 MCG/2ML IJ SOLN
INTRAMUSCULAR | Status: AC
  Filled 2017-09-15: qty 2

## 2017-09-15 MED ORDER — OXYCODONE HCL 5 MG/5ML PO SOLN
5.0000 mg | Freq: Once | ORAL | Status: DC | PRN
  Filled 2017-09-15: qty 5

## 2017-09-15 MED ORDER — BUPIVACAINE-EPINEPHRINE 0.5% -1:200000 IJ SOLN
INTRAMUSCULAR | Status: DC | PRN
  Administered 2017-09-15: 30 mL

## 2017-09-15 MED ORDER — LACTATED RINGERS IR SOLN
Status: DC | PRN
  Administered 2017-09-15: 1000 mL

## 2017-09-15 MED ORDER — SUGAMMADEX SODIUM 200 MG/2ML IV SOLN
INTRAVENOUS | Status: AC
  Filled 2017-09-15: qty 2

## 2017-09-15 MED ORDER — MIDAZOLAM HCL 2 MG/2ML IJ SOLN
INTRAMUSCULAR | Status: AC
  Filled 2017-09-15: qty 2

## 2017-09-15 MED ORDER — GABAPENTIN 300 MG PO CAPS
300.0000 mg | ORAL_CAPSULE | Freq: Two times a day (BID) | ORAL | Status: DC
  Administered 2017-09-15 – 2017-09-20 (×10): 300 mg via ORAL
  Filled 2017-09-15 (×10): qty 1

## 2017-09-15 MED ORDER — LIDOCAINE HCL 2 % IJ SOLN
INTRAMUSCULAR | Status: AC
  Filled 2017-09-15: qty 20

## 2017-09-15 MED ORDER — HYDROCODONE-ACETAMINOPHEN 5-325 MG PO TABS: 1.0000 | ORAL_TABLET | ORAL | Status: DC | PRN

## 2017-09-15 MED ORDER — ENSURE SURGERY PO LIQD
237.0000 mL | Freq: Two times a day (BID) | ORAL | Status: DC
  Administered 2017-09-17 – 2017-09-20 (×4): 237 mL via ORAL
  Filled 2017-09-15 (×11): qty 237

## 2017-09-15 MED ORDER — DEXAMETHASONE SODIUM PHOSPHATE 10 MG/ML IJ SOLN
INTRAMUSCULAR | Status: DC | PRN
  Administered 2017-09-15: 10 mg via INTRAVENOUS

## 2017-09-15 MED ORDER — CELECOXIB 200 MG PO CAPS
200.0000 mg | ORAL_CAPSULE | Freq: Two times a day (BID) | ORAL | Status: DC
  Administered 2017-09-15 – 2017-09-20 (×10): 200 mg via ORAL
  Filled 2017-09-15 (×10): qty 1

## 2017-09-15 MED ORDER — CELECOXIB 200 MG PO CAPS
200.0000 mg | ORAL_CAPSULE | ORAL | Status: AC
  Administered 2017-09-15: 200 mg via ORAL
  Filled 2017-09-15: qty 1

## 2017-09-15 MED ORDER — ROCURONIUM BROMIDE 100 MG/10ML IV SOLN: INTRAVENOUS | Status: DC | PRN

## 2017-09-15 MED ORDER — LIDOCAINE 20MG/ML (2%) 15 ML SYRINGE OPTIME
INTRAMUSCULAR | Status: DC | PRN
  Administered 2017-09-15: 1.5 mg/kg/h via INTRAVENOUS

## 2017-09-15 MED ORDER — OXYCODONE HCL 5 MG PO TABS: 5.0000 mg | ORAL_TABLET | Freq: Once | ORAL | Status: DC | PRN

## 2017-09-15 MED ORDER — ONDANSETRON HCL 4 MG/2ML IJ SOLN: 4.0000 mg | Freq: Four times a day (QID) | INTRAMUSCULAR | Status: DC | PRN

## 2017-09-15 MED ORDER — SODIUM CHLORIDE 0.9 % IV SOLN
2.0000 g | INTRAVENOUS | Status: AC
  Administered 2017-09-15: 2 g via INTRAVENOUS
  Filled 2017-09-15: qty 2

## 2017-09-15 MED ORDER — 0.9 % SODIUM CHLORIDE (POUR BTL) OPTIME
TOPICAL | Status: DC | PRN
  Administered 2017-09-15: 6000 mL

## 2017-09-15 MED ORDER — LIDOCAINE HCL (CARDIAC) PF 100 MG/5ML IV SOSY
PREFILLED_SYRINGE | INTRAVENOUS | Status: DC | PRN
  Administered 2017-09-15: 60 mg via INTRAVENOUS

## 2017-09-15 MED ORDER — PROPOFOL 10 MG/ML IV BOLUS
INTRAVENOUS | Status: DC | PRN
  Administered 2017-09-15: 200 mg via INTRAVENOUS

## 2017-09-15 MED ORDER — KETAMINE HCL 10 MG/ML IJ SOLN
INTRAMUSCULAR | Status: DC | PRN
  Administered 2017-09-15: 35 mg via INTRAVENOUS

## 2017-09-15 MED ORDER — ALVIMOPAN 12 MG PO CAPS
12.0000 mg | ORAL_CAPSULE | ORAL | Status: AC
  Administered 2017-09-15: 12 mg via ORAL
  Filled 2017-09-15: qty 1

## 2017-09-15 MED ORDER — ROCURONIUM BROMIDE 10 MG/ML (PF) SYRINGE
PREFILLED_SYRINGE | INTRAVENOUS | Status: AC
  Filled 2017-09-15: qty 5

## 2017-09-15 MED ORDER — FENTANYL CITRATE (PF) 100 MCG/2ML IJ SOLN
INTRAMUSCULAR | Status: DC | PRN
  Administered 2017-09-15 (×2): 50 ug via INTRAVENOUS
  Administered 2017-09-15: 100 ug via INTRAVENOUS
  Administered 2017-09-15: 50 ug via INTRAVENOUS
  Administered 2017-09-15 (×3): 100 ug via INTRAVENOUS

## 2017-09-15 MED ORDER — SUGAMMADEX SODIUM 200 MG/2ML IV SOLN
INTRAVENOUS | Status: DC | PRN
  Administered 2017-09-15: 200 mg via INTRAVENOUS

## 2017-09-15 MED ORDER — ONDANSETRON HCL 4 MG/2ML IJ SOLN
INTRAMUSCULAR | Status: DC | PRN
  Administered 2017-09-15: 4 mg via INTRAVENOUS

## 2017-09-15 MED ORDER — ALVIMOPAN 12 MG PO CAPS
12.0000 mg | ORAL_CAPSULE | Freq: Two times a day (BID) | ORAL | Status: DC
  Administered 2017-09-16 – 2017-09-17 (×3): 12 mg via ORAL
  Filled 2017-09-15 (×3): qty 1

## 2017-09-15 MED ORDER — KETAMINE HCL 10 MG/ML IJ SOLN
INTRAMUSCULAR | Status: AC
  Filled 2017-09-15: qty 1

## 2017-09-15 MED ORDER — ONDANSETRON HCL 4 MG PO TABS: 4.0000 mg | ORAL_TABLET | Freq: Four times a day (QID) | ORAL | Status: DC | PRN

## 2017-09-15 MED ORDER — CHLORHEXIDINE GLUCONATE 4 % EX LIQD: 60.0000 mL | Freq: Once | CUTANEOUS | Status: DC

## 2017-09-15 MED ORDER — MIDAZOLAM HCL 5 MG/5ML IJ SOLN
INTRAMUSCULAR | Status: DC | PRN
  Administered 2017-09-15: 2 mg via INTRAVENOUS

## 2017-09-15 MED ORDER — ROCURONIUM BROMIDE 10 MG/ML (PF) SYRINGE
PREFILLED_SYRINGE | INTRAVENOUS | Status: DC | PRN
  Administered 2017-09-15 (×5): 10 mg via INTRAVENOUS
  Administered 2017-09-15: 20 mg via INTRAVENOUS
  Administered 2017-09-15: 60 mg via INTRAVENOUS
  Administered 2017-09-15: 20 mg via INTRAVENOUS

## 2017-09-15 MED ORDER — BUPIVACAINE-EPINEPHRINE (PF) 0.5% -1:200000 IJ SOLN
INTRAMUSCULAR | Status: AC
  Filled 2017-09-15: qty 30

## 2017-09-15 MED ORDER — ONDANSETRON HCL 4 MG/2ML IJ SOLN
INTRAMUSCULAR | Status: AC
  Filled 2017-09-15: qty 2

## 2017-09-15 MED ORDER — ENOXAPARIN SODIUM 40 MG/0.4ML ~~LOC~~ SOLN
40.0000 mg | SUBCUTANEOUS | Status: DC
  Administered 2017-09-16 – 2017-09-20 (×5): 40 mg via SUBCUTANEOUS
  Filled 2017-09-15 (×5): qty 0.4

## 2017-09-15 MED ORDER — STERILE WATER FOR IRRIGATION IR SOLN
Status: DC | PRN
  Administered 2017-09-15: 1000 mL

## 2017-09-15 MED ORDER — LACTATED RINGERS IV SOLN
INTRAVENOUS | Status: DC
  Administered 2017-09-15 (×4): via INTRAVENOUS

## 2017-09-15 MED ORDER — LIDOCAINE 20MG/ML (2%) 15 ML SYRINGE OPTIME: INTRAMUSCULAR | Status: DC | PRN

## 2017-09-15 MED ORDER — LACTATED RINGERS IV SOLN
INTRAVENOUS | Status: DC
  Administered 2017-09-15: 17:00:00 via INTRAVENOUS
  Administered 2017-09-16: 1000 mL via INTRAVENOUS

## 2017-09-15 MED ORDER — SODIUM CHLORIDE 0.9 % IV SOLN
2.0000 g | Freq: Two times a day (BID) | INTRAVENOUS | Status: AC
  Administered 2017-09-15: 2 g via INTRAVENOUS
  Filled 2017-09-15: qty 2

## 2017-09-15 MED ORDER — PROPOFOL 10 MG/ML IV BOLUS
INTRAVENOUS | Status: AC
  Filled 2017-09-15: qty 20

## 2017-09-15 SURGICAL SUPPLY — 64 items
APPLIER CLIP 5 13 M/L LIGAMAX5 (MISCELLANEOUS)
APPLIER CLIP ROT 10 11.4 M/L (STAPLE)
BLADE EXTENDED COATED 6.5IN (ELECTRODE) ×3 IMPLANT
BLADE HEX COATED 2.75 (ELECTRODE) ×6 IMPLANT
CELLS DAT CNTRL 66122 CELL SVR (MISCELLANEOUS) ×2 IMPLANT
CLIP APPLIE 5 13 M/L LIGAMAX5 (MISCELLANEOUS) IMPLANT
CLIP APPLIE ROT 10 11.4 M/L (STAPLE) IMPLANT
COUNTER NEEDLE 20 DBL MAG RED (NEEDLE) ×3 IMPLANT
COVER SURGICAL LIGHT HANDLE (MISCELLANEOUS) ×3 IMPLANT
DECANTER SPIKE VIAL GLASS SM (MISCELLANEOUS) IMPLANT
DRAIN CHANNEL 19F RND (DRAIN) ×3 IMPLANT
DRAPE LAPAROSCOPIC ABDOMINAL (DRAPES) ×3 IMPLANT
DRAPE UTILITY XL STRL (DRAPES) ×9 IMPLANT
DRSG OPSITE POSTOP 4X10 (GAUZE/BANDAGES/DRESSINGS) IMPLANT
DRSG OPSITE POSTOP 4X6 (GAUZE/BANDAGES/DRESSINGS) IMPLANT
DRSG OPSITE POSTOP 4X8 (GAUZE/BANDAGES/DRESSINGS) ×6 IMPLANT
DRSG TELFA 3X8 NADH (GAUZE/BANDAGES/DRESSINGS) ×3 IMPLANT
ELECT REM PT RETURN 15FT ADLT (MISCELLANEOUS) ×3 IMPLANT
EVACUATOR SILICONE 100CC (DRAIN) ×3 IMPLANT
GAUZE SPONGE 4X4 12PLY STRL (GAUZE/BANDAGES/DRESSINGS) ×3 IMPLANT
GLOVE BIOGEL PI IND STRL 6.5 (GLOVE) ×8 IMPLANT
GLOVE BIOGEL PI IND STRL 7.0 (GLOVE) ×8 IMPLANT
GLOVE BIOGEL PI IND STRL 7.5 (GLOVE) ×8 IMPLANT
GLOVE BIOGEL PI INDICATOR 6.5 (GLOVE) ×4
GLOVE BIOGEL PI INDICATOR 7.0 (GLOVE) ×4
GLOVE BIOGEL PI INDICATOR 7.5 (GLOVE) ×4
GLOVE ECLIPSE 7.5 STRL STRAW (GLOVE) ×9 IMPLANT
GLOVE EUDERMIC 7 POWDERFREE (GLOVE) ×6 IMPLANT
GOWN STRL REUS W/ TWL LRG LVL3 (GOWN DISPOSABLE) ×4 IMPLANT
GOWN STRL REUS W/TWL LRG LVL3 (GOWN DISPOSABLE) ×2
GOWN STRL REUS W/TWL XL LVL3 (GOWN DISPOSABLE) ×27 IMPLANT
LEGGING LITHOTOMY PAIR STRL (DRAPES) ×3 IMPLANT
PACK COLON (CUSTOM PROCEDURE TRAY) ×3 IMPLANT
PAD POSITIONING PINK XL (MISCELLANEOUS) ×3 IMPLANT
RTRCTR WOUND ALEXIS 18CM MED (MISCELLANEOUS) ×3
SCISSORS LAP 5X35 DISP (ENDOMECHANICALS) ×3 IMPLANT
SET IRRIG TUBING LAPAROSCOPIC (IRRIGATION / IRRIGATOR) ×3 IMPLANT
SHEARS HARMONIC ACE PLUS 36CM (ENDOMECHANICALS) ×3 IMPLANT
SLEEVE ADV FIXATION 5X100MM (TROCAR) ×15 IMPLANT
SLEEVE XCEL OPT CAN 5 100 (ENDOMECHANICALS) ×3 IMPLANT
SOLUTION ANTI FOG 6CC (MISCELLANEOUS) ×3 IMPLANT
SPONGE LAP 18X18 X RAY DECT (DISPOSABLE) ×9 IMPLANT
STAPLER TA28 THK CONTR EEA XL (STAPLE) ×3 IMPLANT
STAPLER VISISTAT 35W (STAPLE) ×3 IMPLANT
STRIP CLOSURE SKIN 1/2X4 (GAUZE/BANDAGES/DRESSINGS) IMPLANT
SUT ETHILON 3 0 PS 1 (SUTURE) ×3 IMPLANT
SUT NOVA T20/GS 25 (SUTURE) ×6 IMPLANT
SUT PDS AB 1 CT1 27 (SUTURE) IMPLANT
SUT PDS AB 1 TP1 96 (SUTURE) ×9 IMPLANT
SUT PROLENE 2 0 KS (SUTURE) ×3 IMPLANT
SUT SILK 2 0 (SUTURE) ×1
SUT SILK 2 0 SH CR/8 (SUTURE) ×3 IMPLANT
SUT SILK 2-0 18XBRD TIE 12 (SUTURE) ×2 IMPLANT
SUT SILK 3 0 (SUTURE)
SUT SILK 3 0 SH CR/8 (SUTURE) ×3 IMPLANT
SUT SILK 3-0 18XBRD TIE 12 (SUTURE) IMPLANT
TAPE STRIPS DRAPE STRL (GAUZE/BANDAGES/DRESSINGS) ×3 IMPLANT
TOWEL OR NON WOVEN STRL DISP B (DISPOSABLE) ×6 IMPLANT
TRAY FOLEY MTR SLVR 16FR STAT (SET/KITS/TRAYS/PACK) ×3 IMPLANT
TROCAR ADV FIXATION 5X100MM (TROCAR) ×3 IMPLANT
TROCAR BLADELESS OPT 5 100 (ENDOMECHANICALS) ×6 IMPLANT
TROCAR XCEL BLUNT TIP 100MML (ENDOMECHANICALS) IMPLANT
TROCAR XCEL NON-BLD 11X100MML (ENDOMECHANICALS) IMPLANT
TUBING INSUF HEATED (TUBING) ×3 IMPLANT

## 2017-09-15 NOTE — Op Note (Addendum)
Patient Name:           Harry Avila   Date of Surgery:        09/15/2017                                   Pre op Diagnosis:      Cancer rectosigmoid colon, stage T4, N0                                       Status post low anterior resection with colostomy(Hartmann procedure)  Post op Diagnosis:    Same  Procedure:                 Rigid proctoscopy, stool disimpaction                                    Laparoscopic assisted resection and closure of colostomy with 28 mm Covidian stapler                                     Takedown splenic flexure                                     Completion proctoscopy and leak test                                       Surgeon:                     Edsel Petrin. Dalbert Batman, M.D., FACS  Assistant:                      Excell Seltzer, MD   Indication for Assistant: Complex exposure and technique.  Operative Indications:   The patient is a 57 year old male who presents with a complaint of preoperative colostomy closure.  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      He presented with complete obstruction of the sigmoid colon secondary to malignancy in January, 2019.    Flexible sigmoidoscopy found a high-grade neoplastic mass at 30 cm. He very quickly was taken to the operating room and underwent laparotomy, sigmoid colectomy and end colostomy. I could palpate a mass in the distal sigmoid colon. I transected the rectosigmoid colon several inches below the tumor and marked the rectal stump with Prolene. Final pathology showed a 5 cm cancer, negative margins, only 7 nodes were examined and they were negative. Pathologic stage T4a, N0. He recovered uneventfully.     Inpatient oncology consult with Dr. Lebron Conners recommended systemic chemotherapy. The patient is now established with an oncologist, Valora Corporal [facility], phone number [phone]. He has recommended Xeloda but the patient has been reluctant  to undergo this. Recent PET scans and CT scans were basically normal. He recently had a completion colonoscopy per rectum and per ostomy. Rectum was clear with mucous balls only. The proximal colon had some small adenomas. We have that report. The  polyps were tubular adenomas without dysplasia...      The patient is feeling well and wants to have his colostomy closed in late May or early June. I referred him to Dr. Marcello Moores for consideration of robotic colostomy closure. She stated that she would do this in July but would not do it sooner. I have agreed to close the colostomy of the end of May to accommodate his scheduling desires. He will be scheduled for laparoscopic assisted resection and closure of his colostomy, possible open colostomy closure. This may require splenic flexure takedown. Adhesions may or may not be bad. We will do this in lithotomy and try to staple the anastomosis. He has been given mechanical and antibiotic bowel prep and also instructed to give himself rectal enemas to clear out the rectal stump I discussed the indications, details, techniques, and numerous risk of the surgery with him. He is aware of the risk of bleeding, infection, temporary diverting loop ileostomy, wound healing problems such as hernia or dehiscence, anastomotic leak requiring reoperation and diversion, injury to adjacent organs with major reconstructive surgery. If he is going to agree to any adjuvant therapy it will be at least 6 weeks postop and longer if he has a complication. He understands all these issues. All of his questions are answered. He agrees with this plan.    Operative Findings:         At the beginning of the case I did a rigid proctoscopy and found several hard stool balls and had to disimpact him.  The rectum was then found to be normal with the stapled end at 13 to 14 cm and healthy mucosa.     There were extensive adhesions which made the case very difficult and tedious.   Numerous loops of small bowel were wrapped around the colostomy and down in the pelvis.  We had to take the splenic flexure down to get anastomosis without tension.  We had a little bit of bleeding from the splenic capsule which was controlled with cautery.  This was hemostatic at the completion of the case.  The rectal stump was barely above the peritoneal reflection at the pelvic floor.  Anastomosis was created with a 28 mm committee and EEA stapler.  2 good donuts.  Had one tiny air bubble at the anterior staple line which was reinforced with 2 figure-of-eight sutures of 2-0 silk and there was no further leak.  There was no evidence of recurrent or metastatic disease anywhere  Operative technique:    Following the induction of general endotracheal anesthesia a Foley catheter was placed.  Oral gastric tube was placed.  Intravenous antibiotics were given.  Surgical timeout was performed.           Rigid proctoscopy was carried out to 13 to 14 cm.  Disimpaction was performed.  Findings as described above.  The entire abdomen and perineum were then prepped and draped in a sterile fashion.  A Veress needle was placed in the left subcostal region without difficulty.  Saline test was good.  Insufflation was uneventful.  5 mm trocar was placed.  There were no adhesions bleeding or injury around this trocar.  We found lots of adhesions in the upper midline, paracolostomy area and pelvis.  We placed several other 5 mm trochars.  We spent a long time taking down adhesions laparoscopically using sharp scissor dissection, a little bit of blunt dissection and occasionally the harmonic scalpel.  We got most of the small bowel adhesions away  from the colostomy.  We pulled some of the adhesions out of the pelvis.  We mobilized the entire left colon and splenic flexure using scissor dissection and harmonic scalpel.  A couple of capsular tears in the spleen were controlled with electrocautery and this was completely hemostatic  and inspected several times during the case.      The colostomy was mobilized by making an incision with a knife around the stoma dissecting the stoma and the colon away from the abdominal wall layers and returning it to the abdominal cavity.  A lower midline incision was made.  Retractors were placed.  We had spend a lot of time taking down further  small bowel adhesions to further mobilize the descending colon but once we did that we had plenty of length for anastomosis without tension.      We identified the Prolene sutures in the rectal stump.  We slowly dissected the rectal stump up out of the pelvis.  Tissues were hard and fibrotic, but once mobilized the rectal stump was soft. .  Proctoscopy was performed and the mucosa looked healthy at the completion of the dissection.  We found a suitable area proximal to the colostomy to place a pursestring device.  The pursestring was placed with 2-0 Prolene on a Keith needle.  The colostomy was resected and sent to the pathology lab.  28 mm Covidian EEA stapler was brought to the operative field.  The anvil was placed into the proximal colon and the pursestring suture tied and it looked very good.  Dr. Excell Seltzer passed the stapler through the rectum up to the end of the rectal stump.  The stapler was opened.  The proximal colon and anvil were secured to the spike of the stapler.  The stapler was closed fired opened and removed.  He had 2 good donuts.  The anastomosis looked excellent.  Proctoscopy showed good healthy mucosa and a wide open anastomosis.  There was one tiny air bubble coming from the anastomosis in the anterior midline where the old Prolene suture was.  This was reinforced with 2 figure-of-eight sutures of 2-0 silk and then a second leak test was performed under tension and there was no air leak whatsoever.  We felt this was satisfactory.       We then irrigated the pelvis and abdomen and left subphrenic space.  Did not not appear to be any bleeding.   We then changed our gowns gloves instruments and drapes according to protocol.  We checked for bleeding once more and it did not appear to be any.  A 19 [place] Blake drain was placed down into the pelvis and brought out through 1 of the right lower quadrant trocar sites.  This was sutured to the skin.  Connected to a suction bulb.      The fascia at the colostomy was closed with interrupted sutures of #1 Novofil.  The midline fascia was closed with a running suture of #1, double-stranded PDS.  All of the incisions were closed with staples.  Telfa wicks were placed between staples of the larger incisions.  Clean bandages were placed and the patient taken to PACU in stable condition.  EBL 500 cc.  Counts correct.  Complications none.  ERAS protocol.     Edsel Petrin. Dalbert Batman, M.D., FACS General and Minimally Invasive Surgery Breast and Colorectal Surgery  09/15/2017 12:58 PM

## 2017-09-15 NOTE — Anesthesia Preprocedure Evaluation (Addendum)
Anesthesia Evaluation  Patient identified by MRN, date of birth, ID band Patient awake    Reviewed: Allergy & Precautions, H&P , NPO status , Patient's Chart, lab work & pertinent test results  Airway Mallampati: II   Neck ROM: full    Dental   Pulmonary neg pulmonary ROS,    breath sounds clear to auscultation       Cardiovascular negative cardio ROS   Rhythm:regular Rate:Normal     Neuro/Psych    GI/Hepatic H/o colon CA   Endo/Other    Renal/GU      Musculoskeletal   Abdominal   Peds  Hematology   Anesthesia Other Findings   Reproductive/Obstetrics                             Anesthesia Physical Anesthesia Plan  ASA: II  Anesthesia Plan: General   Post-op Pain Management:    Induction: Intravenous  PONV Risk Score and Plan: 2 and Ondansetron, Dexamethasone, Midazolam and Treatment may vary due to age or medical condition  Airway Management Planned: Oral ETT  Additional Equipment:   Intra-op Plan:   Post-operative Plan: Extubation in OR  Informed Consent: I have reviewed the patients History and Physical, chart, labs and discussed the procedure including the risks, benefits and alternatives for the proposed anesthesia with the patient or authorized representative who has indicated his/her understanding and acceptance.     Plan Discussed with: Anesthesiologist, Surgeon and CRNA  Anesthesia Plan Comments:         Anesthesia Quick Evaluation

## 2017-09-15 NOTE — Anesthesia Postprocedure Evaluation (Signed)
Anesthesia Post Note  Patient: Harry Avila  Procedure(s) Performed: LAPAROSCOPIC ASSISTED RESECTION AND CLOSURE COLOSTOMY ERAS PATHWAY, takedown of splenic flexure (N/A Abdomen) PROCTOSCOPY (N/A )     Patient location during evaluation: PACU Anesthesia Type: General Level of consciousness: awake and alert Pain management: pain level controlled Vital Signs Assessment: post-procedure vital signs reviewed and stable Respiratory status: spontaneous breathing, nonlabored ventilation, respiratory function stable and patient connected to nasal cannula oxygen Cardiovascular status: blood pressure returned to baseline and stable Postop Assessment: no apparent nausea or vomiting Anesthetic complications: no    Last Vitals:  Vitals:   09/15/17 1436 09/15/17 1551  BP: (!) 154/88 (!) 137/97  Pulse: 60 62  Resp: 18 15  Temp: (!) 36.4 C (!) 36.2 C  SpO2: 99% 100%    Last Pain:  Vitals:   09/15/17 1551  TempSrc: Oral  PainSc:                  [place] S

## 2017-09-15 NOTE — Anesthesia Procedure Notes (Signed)
Procedure Name: Intubation Date/Time: 09/15/2017 8:37 AM Performed by: Glory Buff, CRNA Pre-anesthesia Checklist: Patient identified, Emergency Drugs available, Suction available and Patient being monitored Patient Re-evaluated:Patient Re-evaluated prior to induction Oxygen Delivery Method: Circle system utilized Preoxygenation: Pre-oxygenation with 100% oxygen Induction Type: IV induction Ventilation: Mask ventilation without difficulty Laryngoscope Size: Miller and 3 Grade View: Grade I Tube type: Oral Tube size: 7.5 mm Number of attempts: 1 Airway Equipment and Method: Stylet and Oral airway Placement Confirmation: ETT inserted through vocal cords under direct vision,  positive ETCO2 and breath sounds checked- equal and bilateral Secured at: 21 cm Tube secured with: Tape Dental Injury: Teeth and Oropharynx as per pre-operative assessment

## 2017-09-15 NOTE — Interval H&P Note (Signed)
History and Physical Interval Note:  09/15/2017 7:54 AM  Harry Avila  has presented today for surgery, with the diagnosis of Colon cancer w/colostomy  The various methods of treatment have been discussed with the patient and family. After consideration of risks, benefits and other options for treatment, the patient has consented to  Procedure(s): [place] (N/A) as a surgical intervention .  The patient's history has been reviewed, patient examined, no change in status, stable for surgery.  I have reviewed the patient's chart and labs.  Questions were answered to the patient's satisfaction.     [name]

## 2017-09-15 NOTE — Transfer of Care (Signed)
Immediate Anesthesia Transfer of Care Note  Patient: Harry Avila  Procedure(s) Performed: LAPAROSCOPIC ASSISTED RESECTION AND CLOSURE COLOSTOMY ERAS PATHWAY, takedown of splenic flexure (N/A Abdomen) PROCTOSCOPY (N/A )  Patient Location: PACU  Anesthesia Type:General  Level of Consciousness: awake, alert  and oriented  Airway & Oxygen Therapy: Patient Spontanous Breathing and Patient connected to face mask oxygen  Post-op Assessment: Report given to RN and Post -op Vital signs reviewed and stable  Post vital signs: Reviewed and stable  Last Vitals:  Vitals Value Taken Time  BP 181/99 09/15/2017  1:18 PM  Temp    Pulse 69 09/15/2017  1:22 PM  Resp 15 09/15/2017  1:22 PM  SpO2 100 % 09/15/2017  1:22 PM  Vitals shown include unvalidated device data.  Last Pain:  Vitals:   09/15/17 0621  TempSrc: Oral      Patients Stated Pain Goal: 4 (53/79/43 2761)  Complications: No apparent anesthesia complications

## 2017-09-16 ENCOUNTER — Encounter (HOSPITAL_COMMUNITY): Payer: Self-pay | Admitting: General Surgery

## 2017-09-16 LAB — BASIC METABOLIC PANEL
Anion gap: 6 (ref 5–15)
BUN: 12 mg/dL (ref 6–20)
CO2: 28 mmol/L (ref 22–32)
CREATININE: 1.31 mg/dL — AB (ref 0.61–1.24)
Calcium: 8.7 mg/dL — ABNORMAL LOW (ref 8.9–10.3)
Chloride: 106 mmol/L (ref 101–111)
GFR calc non Af Amer: >60 mL/min (ref >60)
GLUCOSE: 113 mg/dL — AB (ref 65–99)
Potassium: 4.4 mmol/L (ref 3.5–5.1)
Sodium: 140 mmol/L (ref 135–145)

## 2017-09-16 LAB — CBC
HCT: 36.5 % — ABNORMAL LOW (ref 39.0–52.0)
Hemoglobin: 12.2 g/dL — ABNORMAL LOW (ref 13.0–17.0)
MCH: 30.3 pg (ref 26.0–34.0)
MCHC: 33.4 g/dL (ref 30.0–36.0)
MCV: 90.8 fL (ref 78.0–100.0)
PLATELETS: 251 10*3/uL (ref 150–400)
RBC: 4.02 MIL/uL — AB (ref 4.22–5.81)
RDW: 14.6 % (ref 11.5–15.5)
WBC: 11.5 10*3/uL — ABNORMAL HIGH (ref 4.0–10.5)

## 2017-09-16 NOTE — Plan of Care (Signed)
Plan of care discussed with patient 

## 2017-09-16 NOTE — Progress Notes (Signed)
1 Day Post-Op  Subjective: Alert.  Vital signs stable.  Pleasant.  No distress. Friendly Stoic, as usual Denies nausea or respiratory problems No stool or flatus Has been out of bed  Excellent urine output. Hemoglobin 12.2.  Potassium 4.4.  Glucose 113.  Creatinine 1.31 which is baseline.     Objective: Vital signs in last 24 hours: Temp:  [97.2 F (36.2 C)-98.9 F (37.2 C)] 98.7 F (37.1 C) (06/06 0558) Pulse Rate:  [60-77] 70 (06/06 0558) Resp:  [15-18] 18 (06/06 0558) BP: (136-181)/(74-99) 136/74 (06/06 0558) SpO2:  [97 %-100 %] 97 % (06/06 0558) Weight:  [76.1 kg (167 lb 12.3 oz)] 76.1 kg (167 lb 12.3 oz) (06/06 0558) Last BM Date: 09/15/17  Intake/Output from previous day: 06/05 0701 - 06/06 0700 In: 5516.7 [P.O.:200; I.V.:5216.7; IV Piggyback:100] Out: 2841 [Urine:3215; Drains:210; Blood:500] Intake/Output this shift: No intake/output data recorded.  General appearance: Alert.  Friendly.  No distress.  Mental status normal Resp: clear to auscultation bilaterally GI: Soft.  Nondistended.  Silent.  Serosanguineous drainage from drain.  Moderate output.  A little bit of drainage from all wounds.  Dressings being replaced. Extremities: extremities normal, atraumatic, no cyanosis or edema  Lab Results:  Results for orders placed or performed during the hospital encounter of 09/15/17 (from the past 24 hour(s))  CBC     Status: Abnormal   Collection Time: 09/15/17  2:59 PM  Result Value Ref Range   WBC 12.3 (H) 4.0 - 10.5 K/uL   RBC 4.06 (L) 4.22 - 5.81 MIL/uL   Hemoglobin 12.4 (L) 13.0 - 17.0 g/dL   HCT 38.0 (L) 39.0 - 52.0 %   MCV 93.6 78.0 - 100.0 fL   MCH 30.5 26.0 - 34.0 pg   MCHC 32.6 30.0 - 36.0 g/dL   RDW 14.7 11.5 - 15.5 %   Platelets 244 150 - 400 K/uL  Basic metabolic panel     Status: Abnormal   Collection Time: 09/15/17  2:59 PM  Result Value Ref Range   Sodium 137 135 - 145 mmol/L   Potassium 3.7 3.5 - 5.1 mmol/L   Chloride 104 101 - 111 mmol/L    CO2 24 22 - 32 mmol/L   Glucose, Bld 209 (H) 65 - 99 mg/dL   BUN 10 6 - 20 mg/dL   Creatinine, Ser 1.51 (H) 0.61 - 1.24 mg/dL   Calcium 8.3 (L) 8.9 - 10.3 mg/dL   GFR calc non Af Amer 51 (L) >60 mL/min   GFR calc Af Amer 59 (L) >60 mL/min   Anion gap 9 5 - 15  Basic metabolic panel     Status: Abnormal   Collection Time: 09/16/17  4:28 AM  Result Value Ref Range   Sodium 140 135 - 145 mmol/L   Potassium 4.4 3.5 - 5.1 mmol/L   Chloride 106 101 - 111 mmol/L   CO2 28 22 - 32 mmol/L   Glucose, Bld 113 (H) 65 - 99 mg/dL   BUN 12 6 - 20 mg/dL   Creatinine, Ser 1.31 (H) 0.61 - 1.24 mg/dL   Calcium 8.7 (L) 8.9 - 10.3 mg/dL   GFR calc non Af Amer >60 >60 mL/min   GFR calc Af Amer >60 >60 mL/min   Anion gap 6 5 - 15  CBC     Status: Abnormal   Collection Time: 09/16/17  4:28 AM  Result Value Ref Range   WBC 11.5 (H) 4.0 - 10.5 K/uL   RBC 4.02 (L) 4.22 -  5.81 MIL/uL   Hemoglobin 12.2 (L) 13.0 - 17.0 g/dL   HCT 36.5 (L) 39.0 - 52.0 %   MCV 90.8 78.0 - 100.0 fL   MCH 30.3 26.0 - 34.0 pg   MCHC 33.4 30.0 - 36.0 g/dL   RDW 14.6 11.5 - 15.5 %   Platelets 251 150 - 400 K/uL     Studies/Results: No results found.  Marland Kitchen alvimopan  12 mg Oral BID  . celecoxib  200 mg Oral BID  . enoxaparin (LOVENOX) injection  40 mg Subcutaneous Q24H  . feeding supplement  237 mL Oral BID BM  . gabapentin  300 mg Oral BID     Assessment/Plan: s/p Procedure(s): LAPAROSCOPIC ASSISTED RESECTION AND CLOSURE COLOSTOMY ERAS PATHWAY, takedown of splenic flexure PROCTOSCOPY  POD #1.  Laparoscopic assisted lysis of adhesions, takedown splenic flexure, colostomy reversal with 28 mm stapler. -Stable- -Liquid diet.  ERAS pathway.  Entereg. -IV cut back to 75 cc/h -Mobilize -Foley out tomorrow -Hopefully drain can be removed prior to discharge -Telfa wicks out of wound POD 3 or 4  -He knows that I will be out of town for 2 weeks and he is care will be provided by my partners.  He is very comfortable with  that.  Cancer rectosigmoid colon.  Stage T4, N0. Initial presentation January, 2019 with complete obstruction.  Status post [facility] resection Followed by medical oncology in [place].  Has not started Xeloda yet.   @PROBHOSP @  LOS: 1 day    [name] 09/16/2017

## 2017-09-17 LAB — BASIC METABOLIC PANEL
Anion gap: 7 (ref 5–15)
BUN: 15 mg/dL (ref 6–20)
CALCIUM: 8.7 mg/dL — AB (ref 8.9–10.3)
CHLORIDE: 105 mmol/L (ref 101–111)
CO2: 29 mmol/L (ref 22–32)
CREATININE: 1.41 mg/dL — AB (ref 0.61–1.24)
GFR calc non Af Amer: 55 mL/min — ABNORMAL LOW (ref >60)
Glucose, Bld: 97 mg/dL (ref 65–99)
Potassium: 4.7 mmol/L (ref 3.5–5.1)
Sodium: 141 mmol/L (ref 135–145)

## 2017-09-17 LAB — CBC
HEMATOCRIT: 33.9 % — AB (ref 39.0–52.0)
HEMOGLOBIN: 11.3 g/dL — AB (ref 13.0–17.0)
MCH: 31 pg (ref 26.0–34.0)
MCHC: 33.3 g/dL (ref 30.0–36.0)
MCV: 92.9 fL (ref 78.0–100.0)
Platelets: 214 10*3/uL (ref 150–400)
RBC: 3.65 MIL/uL — ABNORMAL LOW (ref 4.22–5.81)
RDW: 14.8 % (ref 11.5–15.5)
WBC: 10.6 10*3/uL — ABNORMAL HIGH (ref 4.0–10.5)

## 2017-09-17 NOTE — Progress Notes (Signed)
Pt reported having his fist bowel movement, liquid with green particles.

## 2017-09-17 NOTE — Progress Notes (Signed)
Patient ID: Harry Avila, male   DOB: 29-Apr-1960, 57 y.o.   MRN: [id number] 2 Days Post-Op   Subjective: No complaints this morning.  Denies pain or nausea.  Had a small amount of flatus.  Up and ambulating.  Objective: Vital signs in last 24 hours: Temp:  [98.2 F (36.8 C)-98.9 F (37.2 C)] 98.7 F (37.1 C) (06/07 0425) Pulse Rate:  [64-74] 73 (06/07 0425) Resp:  [12-16] 16 (06/07 0425) BP: (127-150)/(77-91) 133/77 (06/07 0425) SpO2:  [94 %-100 %] 94 % (06/07 0425) Weight:  [70.9 kg (156 lb 3.2 oz)-74.4 kg (164 lb 0.4 oz)] 74.4 kg (164 lb 0.4 oz) (06/07 0425) Last BM Date: 09/15/17  Intake/Output from previous day: 06/06 0701 - 06/07 0700 In: 3070.8 [P.O.:1230; I.V.:1820.8] Out: 2155 [Urine:2100; Drains:55] Intake/Output this shift: Total I/O In: -  Out: 250 [Urine:250]  General appearance: alert, cooperative and no distress GI: normal findings: soft, non-tender and Nondistended Incision/Wound: Some thin bloody drainage at old ostomy site and JP site.  JP serosanguineous.  Lab Results:  Recent Labs    09/16/17 0428 09/17/17 0420  WBC 11.5* 10.6*  HGB 12.2* 11.3*  HCT 36.5* 33.9*  PLT 251 214   BMET Recent Labs    09/16/17 0428 09/17/17 0420  NA 140 141  K 4.4 4.7  CL 106 105  CO2 28 29  GLUCOSE 113* 97  BUN 12 15  CREATININE 1.31* 1.41*  CALCIUM 8.7* 8.7*     Studies/Results: No results found.  Anti-infectives: Anti-infectives (From admission, onward)   Start     Dose/Rate Route Frequency Ordered Stop   09/15/17 2000  cefoTEtan (CEFOTAN) 2 g in sodium chloride 0.9 % 100 mL IVPB     2 g 200 mL/hr over 30 Minutes Intravenous Every 12 hours 09/15/17 1439 09/15/17 2103   09/15/17 0630  cefoTEtan (CEFOTAN) 2 g in sodium chloride 0.9 % 100 mL IVPB     2 g 200 mL/hr over 30 Minutes Intravenous On call to O.R. 09/15/17 7209 09/15/17 0908      Assessment/Plan: s/p Procedure(s): LAPAROSCOPIC ASSISTED RESECTION AND CLOSURE COLOSTOMY ERAS  PATHWAY, takedown of splenic flexure PROCTOSCOPY Doing well postoperatively without apparent complication.  Has flatus.  Advance to full liquid diet.  Slight drop in hemoglobin and some bloody drainage.  Check CBC tomorrow.   LOS: 2 days    Edward Jolly 09/17/2017

## 2017-09-17 NOTE — Progress Notes (Signed)
Pt's iv came out and he wanted to see if it could be left out.  Called MD, order was given to leave out but encourage fluids.

## 2017-09-18 LAB — CBC
HCT: 32.8 % — ABNORMAL LOW (ref 39.0–52.0)
Hemoglobin: 11 g/dL — ABNORMAL LOW (ref 13.0–17.0)
MCH: 30.9 pg (ref 26.0–34.0)
MCHC: 33.5 g/dL (ref 30.0–36.0)
MCV: 92.1 fL (ref 78.0–100.0)
Platelets: 246 10*3/uL (ref 150–400)
RBC: 3.56 MIL/uL — ABNORMAL LOW (ref 4.22–5.81)
RDW: 14.9 % (ref 11.5–15.5)
WBC: 9.1 10*3/uL (ref 4.0–10.5)

## 2017-09-18 NOTE — Progress Notes (Signed)
3 Days Post-Op   Subjective/Chief Complaint: A lot of flatus and 2-3 loose stools (no blood) ambulating a lot, pain controlled   Objective: Vital signs in last 24 hours: Temp:  [98 F (36.7 C)-99.2 F (37.3 C)] 99.2 F (37.3 C) (06/08 0539) Pulse Rate:  [65-76] 65 (06/08 0539) Resp:  [16-17] 17 (06/08 0539) BP: (131-176)/(76-103) 131/76 (06/08 0539) SpO2:  [100 %] 100 % (06/08 0539) Last BM Date: 09/17/17  Intake/Output from previous day: 06/07 0701 - 06/08 0700 In: 997.5 [P.O.:660; I.V.:337.5] Out: 372 [Urine:325; Drains:47] Intake/Output this shift: No intake/output data recorded.  General appearance: no distress Resp: clear to auscultation bilaterally Cardio: regular rate and rhythm GI: incision clean wicks in place drain serosang minimal drainage at old ostomy site, nondistended has bs   Lab Results:  Recent Labs    09/17/17 0420 09/18/17 0455  WBC 10.6* 9.1  HGB 11.3* 11.0*  HCT 33.9* 32.8*  PLT 214 246   BMET Recent Labs    09/16/17 0428 09/17/17 0420  NA 140 141  K 4.4 4.7  CL 106 105  CO2 28 29  GLUCOSE 113* 97  BUN 12 15  CREATININE 1.31* 1.41*  CALCIUM 8.7* 8.7*   PT/INR No results for input(s): LABPROT, INR in the last 72 hours. ABG No results for input(s): PHART, HCO3 in the last 72 hours.  Invalid input(s): PCO2, PO2  Studies/Results: No results found.  Anti-infectives: Anti-infectives (From admission, onward)   Start     Dose/Rate Route Frequency Ordered Stop   09/15/17 2000  cefoTEtan (CEFOTAN) 2 g in sodium chloride 0.9 % 100 mL IVPB     2 g 200 mL/hr over 30 Minutes Intravenous Every 12 hours 09/15/17 1439 09/15/17 2103   09/15/17 0630  cefoTEtan (CEFOTAN) 2 g in sodium chloride 0.9 % 100 mL IVPB     2 g 200 mL/hr over 30 Minutes Intravenous On call to O.R. 09/15/17 0621 09/15/17 0908      Assessment/Plan: POD 3 lap assist colostomy takedown- Harry Avila  1. Having bowel function, will give regular diet per request and he  will do soft food off of that (does not like dysphagia and soft diets) 2. pulm toilet, ambulate 3. Check cbc and bmet tomorrow 4. Possibly home 24-48 hours  Rolm Bookbinder 09/18/2017

## 2017-09-19 LAB — BASIC METABOLIC PANEL
Anion gap: 6 (ref 5–15)
BUN: 10 mg/dL (ref 6–20)
CO2: 27 mmol/L (ref 22–32)
Calcium: 8.6 mg/dL — ABNORMAL LOW (ref 8.9–10.3)
Chloride: 104 mmol/L (ref 101–111)
Creatinine, Ser: 1.19 mg/dL (ref 0.61–1.24)
GFR calc Af Amer: >60 mL/min (ref >60)
GFR calc non Af Amer: >60 mL/min (ref >60)
Glucose, Bld: 107 mg/dL — ABNORMAL HIGH (ref 65–99)
Potassium: 3.9 mmol/L (ref 3.5–5.1)
Sodium: 137 mmol/L (ref 135–145)

## 2017-09-19 LAB — CBC
HEMATOCRIT: 29.8 % — AB (ref 39.0–52.0)
Hemoglobin: 9.8 g/dL — ABNORMAL LOW (ref 13.0–17.0)
MCH: 30.4 pg (ref 26.0–34.0)
MCHC: 32.9 g/dL (ref 30.0–36.0)
MCV: 92.5 fL (ref 78.0–100.0)
PLATELETS: 230 10*3/uL (ref 150–400)
RBC: 3.22 MIL/uL — ABNORMAL LOW (ref 4.22–5.81)
RDW: 14.6 % (ref 11.5–15.5)
WBC: 8 10*3/uL (ref 4.0–10.5)

## 2017-09-19 MED ORDER — BENAZEPRIL HCL 10 MG PO TABS
40.0000 mg | ORAL_TABLET | ORAL | Status: DC
  Administered 2017-09-19: 40 mg via ORAL
  Filled 2017-09-19: qty 4

## 2017-09-19 MED ORDER — CARVEDILOL 3.125 MG PO TABS
3.1250 mg | ORAL_TABLET | Freq: Two times a day (BID) | ORAL | Status: DC
  Administered 2017-09-19 – 2017-09-20 (×3): 3.125 mg via ORAL
  Filled 2017-09-19 (×3): qty 1

## 2017-09-19 NOTE — Progress Notes (Addendum)
4 Days Post-Op   Subjective/Chief Complaint: Still with some loose stools, passing flatus, not using pain meds, walking a lot, ate some food   Objective: Vital signs in last 24 hours: Temp:  [97.8 F (36.6 C)-99 F (37.2 C)] 97.8 F (36.6 C) (06/09 0641) Pulse Rate:  [64-75] 64 (06/09 0641) Resp:  [16-18] 18 (06/09 0641) BP: (146-150)/(87-92) 146/87 (06/09 0641) SpO2:  [100 %] 100 % (06/09 0641) Weight:  [71.4 kg (157 lb 6.4 oz)] 71.4 kg (157 lb 6.4 oz) (06/08 0827) Last BM Date: 09/18/17  Intake/Output from previous day: 06/08 0701 - 06/09 0700 In: 1335 [P.O.:1335] Out: 700 [Urine:600; Drains:100] Intake/Output this shift: No intake/output data recorded.  GI: soft bs present drain serosang, midline and colostomy incision with wicks with some bloody drainage, no infection cv rr Lungs clear  Lab Results:  Recent Labs    09/18/17 0455 09/19/17 0340  WBC 9.1 8.0  HGB 11.0* 9.8*  HCT 32.8* 29.8*  PLT 246 230   BMET Recent Labs    09/17/17 0420 09/19/17 0340  NA 141 137  K 4.7 3.9  CL 105 104  CO2 29 27  GLUCOSE 97 107*  BUN 15 10  CREATININE 1.41* 1.19  CALCIUM 8.7* 8.6*   PT/INR No results for input(s): LABPROT, INR in the last 72 hours. ABG No results for input(s): PHART, HCO3 in the last 72 hours.  Invalid input(s): PCO2, PO2  Studies/Results: No results found.  Anti-infectives: Anti-infectives (From admission, onward)   Start     Dose/Rate Route Frequency Ordered Stop   09/15/17 2000  cefoTEtan (CEFOTAN) 2 g in sodium chloride 0.9 % 100 mL IVPB     2 g 200 mL/hr over 30 Minutes Intravenous Every 12 hours 09/15/17 1439 09/15/17 2103   09/15/17 0630  cefoTEtan (CEFOTAN) 2 g in sodium chloride 0.9 % 100 mL IVPB     2 g 200 mL/hr over 30 Minutes Intravenous On call to O.R. 09/15/17 0621 09/15/17 0908      Assessment/Plan: POD 4 lap assist colostomy takedown- Dalbert Batman  1. Will do more regular diet today 2. pulm toilet, ambulate 3.abl anemia-  cbc a little lower today, no evidence any bleeding 4. Possibly home tomorrow 5. Restart home bp meds    Rolm Bookbinder 09/19/2017

## 2017-09-20 MED ORDER — TRAMADOL HCL 50 MG PO TABS: 50.0000 mg | ORAL_TABLET | Freq: Four times a day (QID) | ORAL | 0 refills | Status: AC | PRN

## 2017-09-20 NOTE — Discharge Instructions (Signed)
POST OP INSTRUCTIONS  1. DIET: As tolerated. Be sure to include lots of fluids daily.  Avoid fast food or heavy meals as your are more likely to get nauseated.  Eat a low fat the next few days after surgery.  2. Take your usually prescribed home medications unless otherwise directed.  3. PAIN CONTROL: a. Pain is best controlled by a usual combination of three different methods TOGETHER: i. Ice/Heat ii. Over the counter pain medication iii. Prescription pain medication b. Most patients will experience some swelling and bruising around the surgical site.  Ice packs or heating pads (30-60 minutes up to 6 times a day) will help. Some people prefer to use ice alone, heat alone, alternating between ice & heat.  Experiment to what works for you.  Swelling and bruising can take several weeks to resolve.   c. It is helpful to take an over-the-counter pain medication regularly for the first few weeks: i. Acetaminophen (Tylenol) - you may take 650mg  every 6 hours as needed. You can take this with motrin as they act differently on the body. If you are taking a narcotic pain medication that has acetaminophen in it, do not take over the counter tylenol at the same time. d. A  prescription for pain medication should be given to you upon discharge.  Take your pain medication as prescribed if your pain is not adequatly controlled with the over-the-counter pain reliefs mentioned above.  4. Avoid getting constipated.  Between the surgery and the pain medications, it is common to experience some constipation.  Increasing fluid intake and taking a fiber supplement (such as Metamucil, Citrucel, FiberCon, MiraLax, etc) 1-2 times a day regularly will usually help prevent this problem from occurring.  A mild laxative (prune juice, Milk of Magnesia, MiraLax, etc) should be taken according to package directions if there are no bowel movements after 48 hours.    5. Wound care: As instructed in the hospital; you may bathe  normally in a shower. Avoid baths/pools/lakes/oceans until your wounds have fully healed.  6. ACTIVITIES as tolerated:   a. Avoid heavy lifting (>10lbs or 1 gallon of milk) for the next 6 weeks. b. You may resume regular (light) daily activities - such as daily self-care, walking, climbing stairs - gradually increasing activities as tolerated.  If you can walk 30 minutes without difficulty, it is safe to try more intense activity such as jogging, treadmill, bicycling, low-impact aerobics.  c. DO NOT PUSH THROUGH PAIN.  Let pain be your guide: If it hurts to do something, don't do it. d. Dennis Bast may drive when you are no longer taking prescription pain medication, you can comfortably wear a seatbelt, and you can safely maneuver your car and apply brakes.  7. FOLLOW UP in our office a. Please call [facility] at [phone] to set up an appointment to see your surgeon in the office for a follow-up appointment approximately 2 weeks after your surgery. b. Make sure that you call for this appointment the day you arrive home to insure a convenient appointment time.  9. If you have disability or family leave forms that need to be completed, you may have them completed by your primary care physician's office; for return to work instructions, please ask our office staff and they will be happy to assist you in obtaining this documentation   When to call us [phone]: 1. Poor pain control 2. Reactions / problems with new medications (rash/itching, etc)  3. Fever over 101.5 F (38.5  C) 4. Inability to urinate 5. Nausea/vomiting 6. Worsening swelling or bruising 7. Continued bleeding from incision. 8. Increased pain, redness, or drainage from the incision  The clinic staff is available to answer your questions during regular business hours (8:30am-5pm).  Please dont hesitate to call and ask to speak to one of our nurses for clinical concerns.   A surgeon from [facility] is always on call at the  hospitals   If you have a medical emergency, go to the nearest emergency room or call 911.  [facility], [place] [address] [place], [place], [place]  [address] MAIN: [phone] FAX: [phone] [email or web address]

## 2017-09-20 NOTE — Discharge Summary (Signed)
Patient ID: Harry Avila MRN: [id number] DOB/AGE: December 01, 1960 57 y.o.  Admit date: 09/15/2017 Discharge date: 09/20/2017  Discharge Diagnoses Patient Active Problem List   Diagnosis Date Noted  . Colon malignant obstruction s/p colectomy/colostomy 04/29/2017 05/02/2017  . 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 Colonic mass   . 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 SBO (small bowel obstruction) ([place]) 04/27/2017  . Hyponatremia 04/27/2017  . Renal insufficiency 04/27/2017    Consultants None  HPI: The patient is a 57 year old male who presents with a complaint of preoperative colostomy closure. This is a healthy 57 year old gentleman. 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      He presented with complete obstruction of the sigmoid colon secondary to malignancy in January. Flexible sigmoidoscopy found a high-grade neoplastic mass at 30 cm. He very quickly was taken to the operating room and underwent laparotomy, sigmoid colectomy and end colostomy. I could palpate a mass in the distal sigmoid colon. I transected the rectosigmoid colon several inches below the tumor and marked the rectal stump with Prolene. Final pathology showed a 5 cm cancer, negative margins, only 7 nodes were examined and they were negative. Pathologic stage T4a, N0. He recovered uneventfully.     Inpatient oncology consult with Dr. Lebron Conners recommended systemic chemotherapy. The patient is now established with an oncologist, Valora Corporal [facility], phone number [phone]. He has recommended Xeloda but the patient has been reluctant to undergo this. Recent PET scans and CT scans were reportedly normal per patient. I've asked him to fax that to me. He recently had a completion colonoscopy per rectum and per ostomy. Rectum was clear with mucous only. The proximal colon had some small adenomas. We have that report. The patient states he was told they were  all completely benign. I have requested that pathology report.      He states he has an appointment to see another oncologist for a second opinion on May 8.      The patient is feeling well and wants to have his colostomy closed in late May or early June. I referred him to Dr. Marcello Moores for consideration of robotic colostomy closure. She stated that she would do this in July but would not do it sooner. I have agreed to close the colostomy of the end of May to accommodate his scheduling desires. He will be scheduled for laparoscopic assisted resection and closure of his colostomy, possible open colostomy closure. This may require splenic flexure takedown. Adhesions may or may not be bad. We will do this in lithotomy and try to staple the anastomosis. He has been given mechanical and antibiotic bowel prep and also instructed to give himself rectal enemas to clear out the rectal stump I discussed the indications, details, techniques, and numerous risk of the surgery with him. He is aware of the risk of bleeding, infection, temporary diverting loop ileostomy, wound healing problems such as hernia or dehiscence, anastomotic leak requiring reoperation and diversion, injury to adjacent organs with major reconstructive surgery. If he is going to agree to any adjuvant therapy it will be at least 6 weeks postop and longer if he has a complication. He understands all these issues. All of his questions are answered. He agrees with this plan.  Procedures OR 09/15/17 with Dr. Dalbert Batman for laparoscopic assisted resection and closure of colostomy and takedown of splenic flexure with rigid proctoscopy  Hospital Course:  He was admitted to the  hostpial postoperatively where he ultimately recovered uneventfully. His diet was gradually advanced and he was having bowel movements. He tolerated a regular diet. His pain was well controlled on oral analgesics - actually without opioids. He did well and was ultimately  discharged home on POD#5.   Allergies as of 09/20/2017   No Known Allergies     Medication List    STOP taking these medications   metroNIDAZOLE 500 MG tablet Commonly known as:  FLAGYL   neomycin 500 MG tablet Commonly known as:  MYCIFRADIN   oxyCODONE 5 MG immediate release tablet Commonly known as:  Oxy IR/ROXICODONE     TAKE these medications   benazepril 40 MG tablet Commonly known as:  LOTENSIN Take 40 mg by mouth once a week.   carvedilol 3.125 MG tablet Commonly known as:  COREG Take 1 tablet (3.125 mg total) by mouth 2 (two) times daily with a meal.   ONE-A-DAY MENS PO Take 1 tablet by mouth daily.   traMADol 50 MG tablet Commonly known as:  ULTRAM Take 1 tablet (50 mg total) by mouth every 6 (six) hours as needed for up to 5 days (pain not controlled with tylenol).   VITAMIN C PO Take 1 tablet by mouth daily.        Sharon Mt. Dema Severin, M.D. [facility]

## 2017-09-20 NOTE — Progress Notes (Signed)
Discharge paperwork discussed with pt. Questions were answered and he demonstrated understanding. Dressing was changed prior to discharge.  Pt escorted to main lobby in stable condition.

## 2017-09-20 NOTE — Progress Notes (Signed)
5 Days Post-Op   Subjective/Chief Complaint: Still with some loose stools, passing flatus, not using pain meds, walking a lot, ate some food   Objective: Vital signs in last 24 hours: Temp:  [97.8 F (36.6 C)-98.8 F (37.1 C)] 98.2 F (36.8 C) (06/10 0541) Pulse Rate:  [58-67] 61 (06/10 0541) Resp:  [14-18] 14 (06/10 0541) BP: (130-148)/(82-90) 130/88 (06/10 0541) SpO2:  [100 %] 100 % (06/10 0541) Weight:  [76 kg (167 lb 8.8 oz)] 76 kg (167 lb 8.8 oz) (06/10 0541) Last BM Date: 09/19/17  Intake/Output from previous day: 06/09 0701 - 06/10 0700 In: 1140 [P.O.:1140] Out: 100 [Drains:100] Intake/Output this shift: No intake/output data recorded.  GI: soft, nontender, nondistended. JP serosang, midline and colostomy incision with wicks with scant bloody drainage - all removed, no infection cv rr Lungs clear  Lab Results:  Recent Labs    09/18/17 0455 09/19/17 0340  WBC 9.1 8.0  HGB 11.0* 9.8*  HCT 32.8* 29.8*  PLT 246 230   BMET Recent Labs    09/19/17 0340  NA 137  K 3.9  CL 104  CO2 27  GLUCOSE 107*  BUN 10  CREATININE 1.19  CALCIUM 8.6*   Anti-infectives: Anti-infectives (From admission, onward)   Start     Dose/Rate Route Frequency Ordered Stop   09/15/17 2000  cefoTEtan (CEFOTAN) 2 g in sodium chloride 0.9 % 100 mL IVPB     2 g 200 mL/hr over 30 Minutes Intravenous Every 12 hours 09/15/17 1439 09/15/17 2103   09/15/17 0630  cefoTEtan (CEFOTAN) 2 g in sodium chloride 0.9 % 100 mL IVPB     2 g 200 mL/hr over 30 Minutes Intravenous On call to O.R. 09/15/17 0621 09/15/17 0908      Assessment/Plan: POD 5 lap assist colostomy takedown- Dalbert Batman  -Diet as tolerated -Ambulate 5x/day -JP drain removed -Possible D/C later today if doing well  Ileana Roup 09/20/2017

## 2018-04-12 IMAGING — CT CT CHEST W/ CM
2 of 3 series · 15 of 36 positions shown, 18 images · IV contrast (iopamidol)
Comparison: 04/27/2017 CT abdomen/pelvis.

CLINICAL DATA: New diagnosis of sigmoid colon cancer status post
partial colectomy on 04/29/2017. Chest staging.

EXAM:
CT CHEST WITH CONTRAST
TECHNIQUE: Multidetector CT imaging of the chest was performed during
intravenous contrast administration.
CONTRAST:  75mL [id number]-ROO IOPAMIDOL ([id number]-ROO) INJECTION 61%

[Series 2: axial st · axial · 0.71mm/px · z∈[-259,-19]mm · 12 of 142 slices shown, 15 images]
[im 11/142  mediastinal]
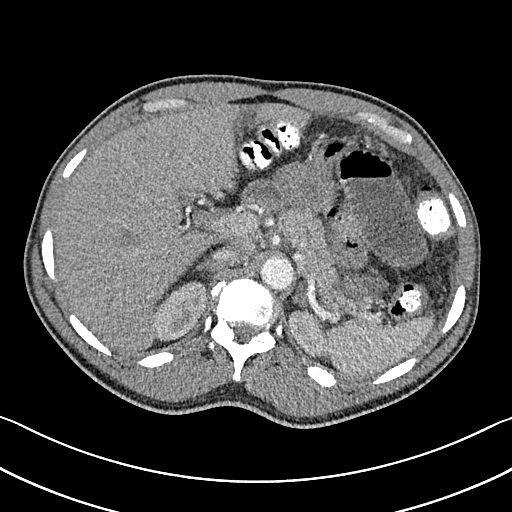
[im 11/142  lung]
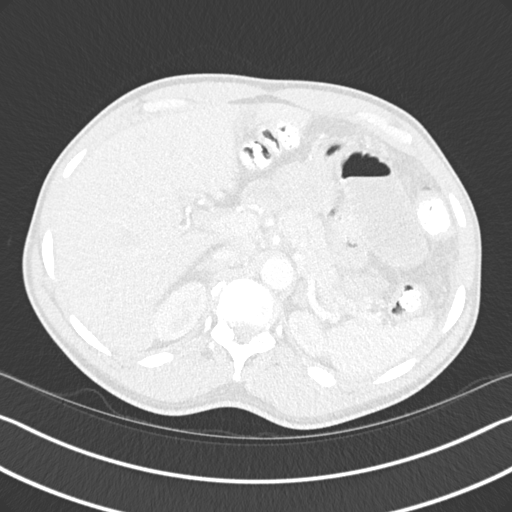
[im 21/142  lung]
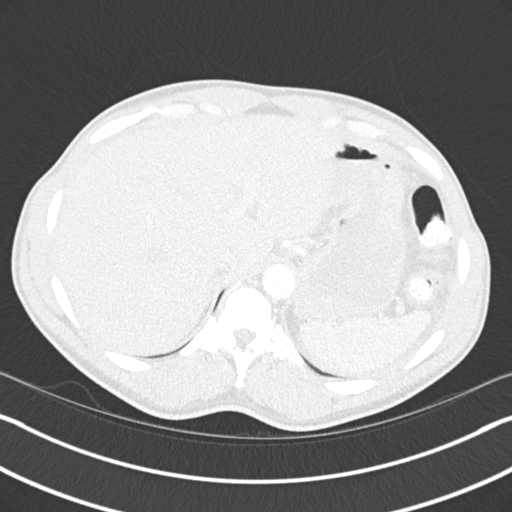
[im 32/142  lung]
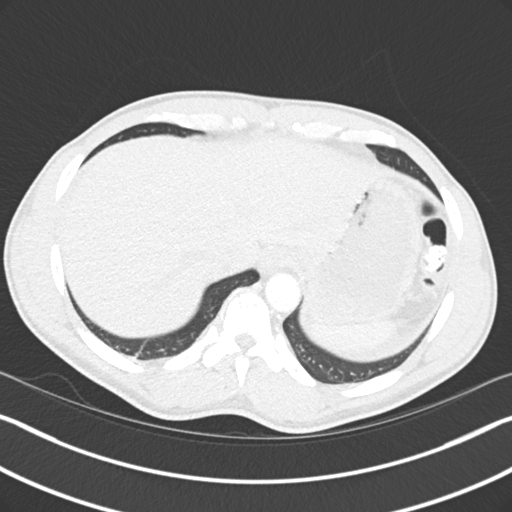
[im 42/142  lung]
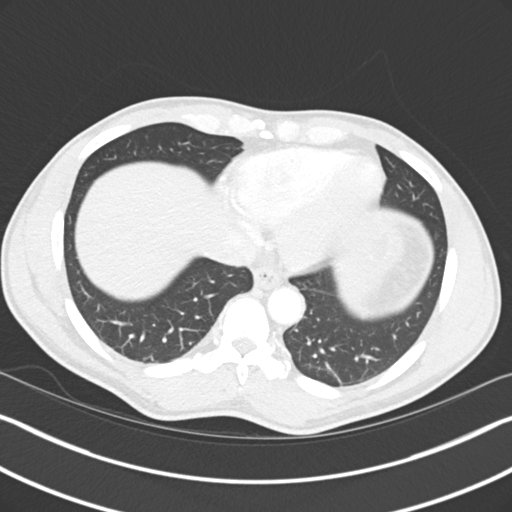
[im 53/142  mediastinal]
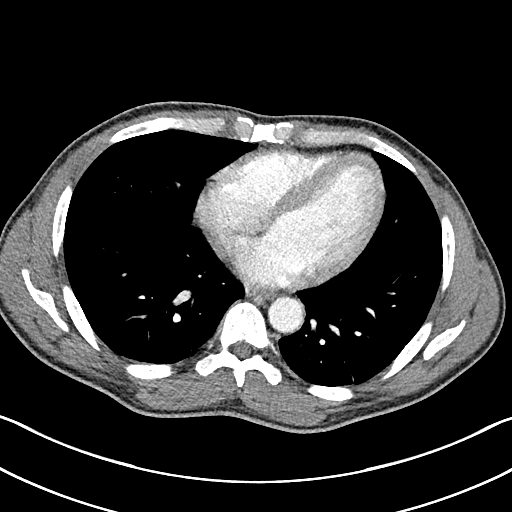
[im 53/142  lung]
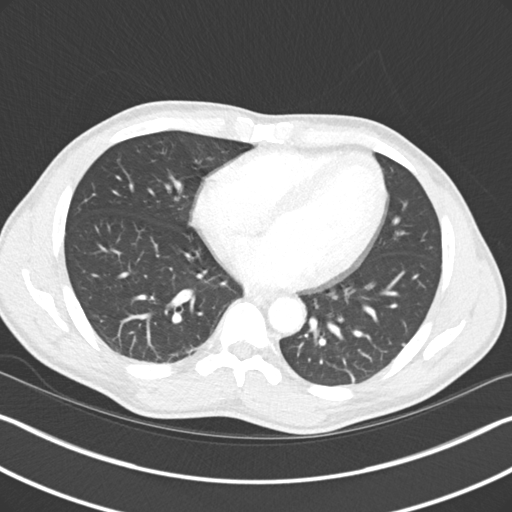
[im 63/142  lung]
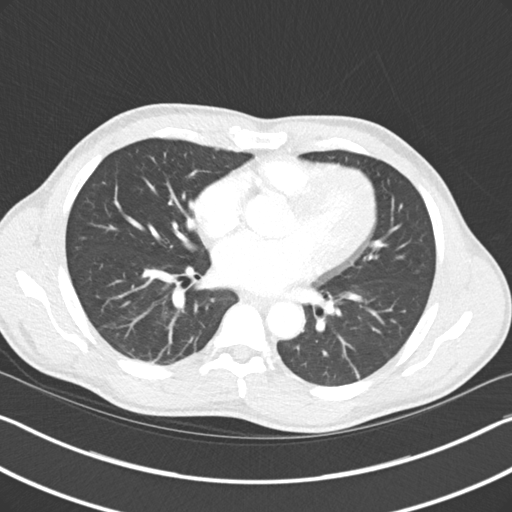
[im 79/142  lung]
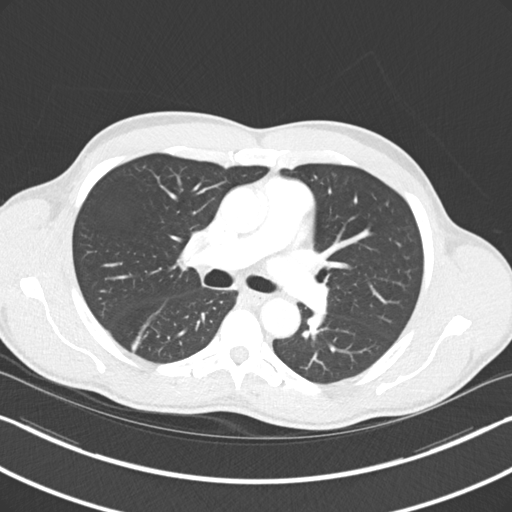
[im 89/142  lung]
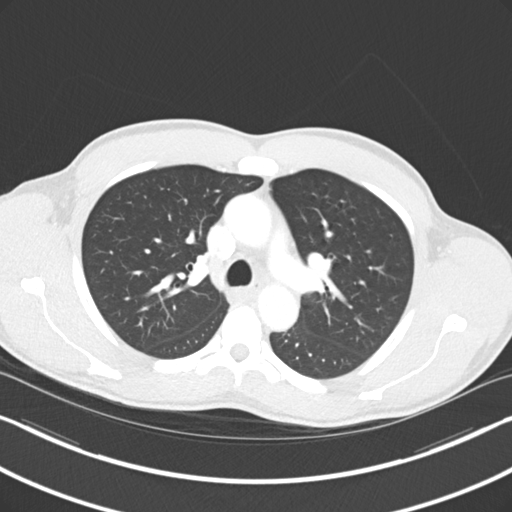
[im 100/142  mediastinal]
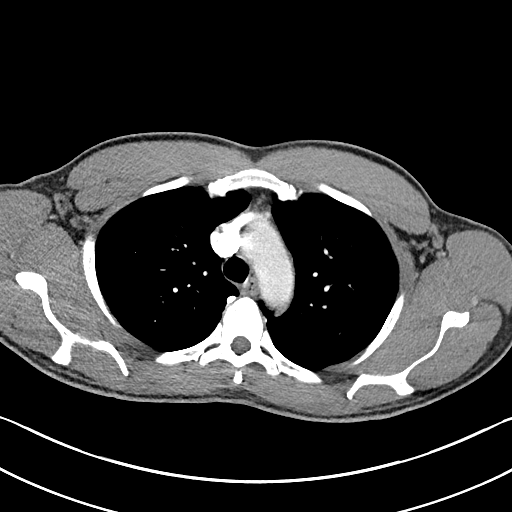
[im 100/142  lung]
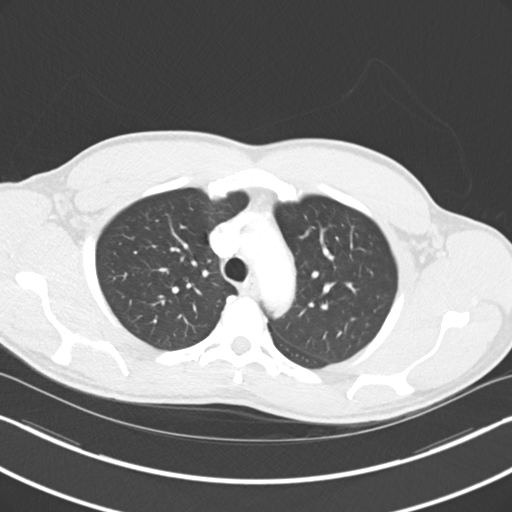
[im 110/142  lung]
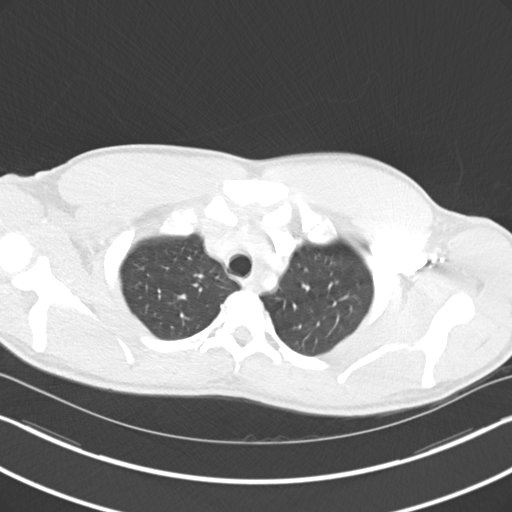
[im 121/142  lung]
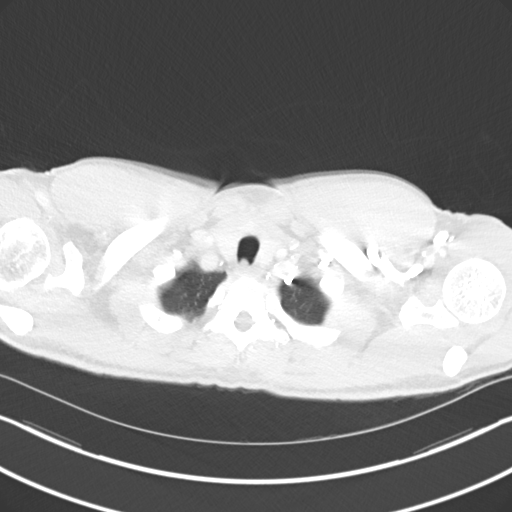
[im 131/142  lung]
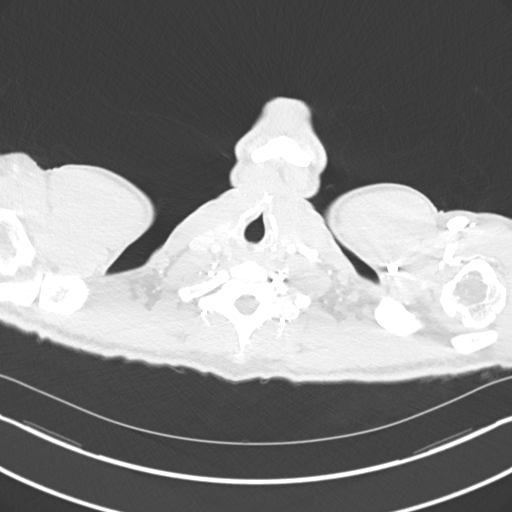

[Series 6: coronal · coronal · 0.57mm/px · 3 of 129 slices shown]
[im 26/129  lung]
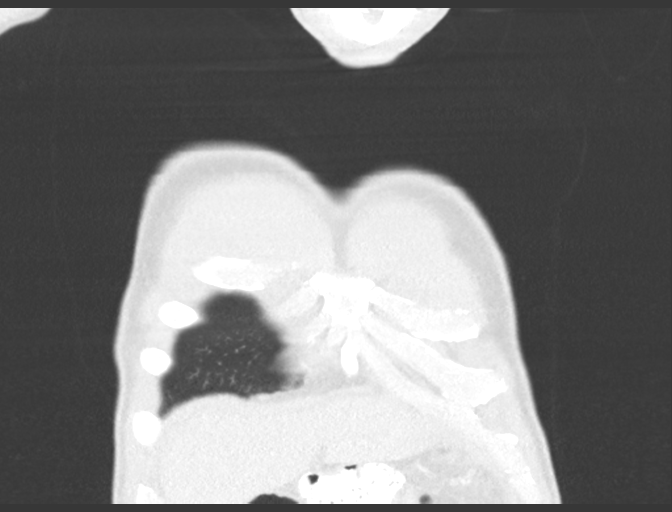
[im 52/129  lung]
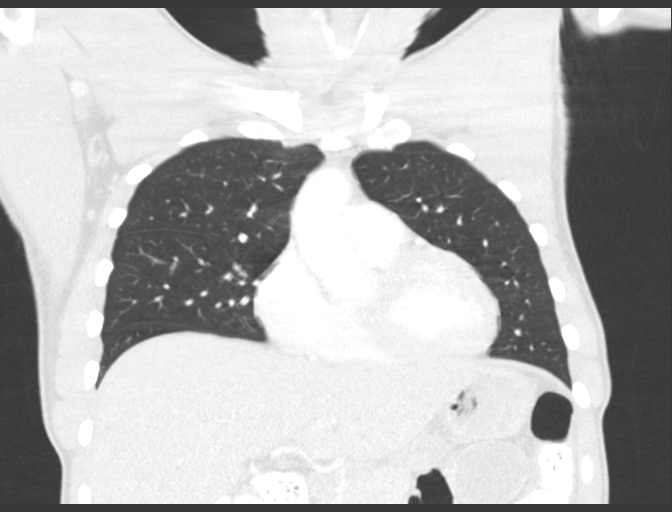
[im 77/129  lung]
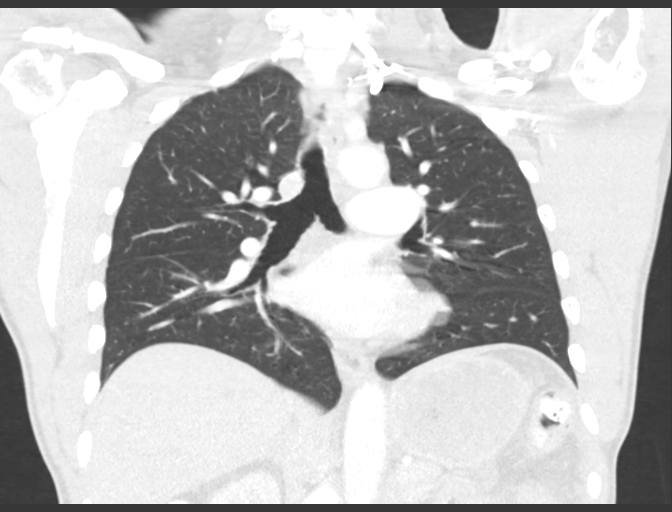

[15 of 36 positions shown; findings below may reference images not displayed]

FINDINGS: Cardiovascular: Normal heart size. No significant pericardial
fluid/thickening. Great vessels are normal in course and caliber. No
central pulmonary emboli.

Mediastinum/Nodes: No discrete thyroid nodules. Unremarkable
esophagus. No pathologically enlarged axillary, mediastinal or hilar
lymph nodes.

Lungs/Pleura: No pneumothorax. No pleural effusion. No acute
consolidative airspace disease or lung masses. Two solid pulmonary
nodules measuring 4 mm in the right lower lobe (series 5/image 73)
and 2 mm in the left lower lobe (series 5/image 90). No additional
significant pulmonary nodules.

Upper abdomen: Redemonstration of indeterminate hypodense 1.1 cm
posterior right liver lobe lesion (series 2/image 120).
Redemonstration of subcentimeter hypodense anterior liver lesion
(series 2/image 133), too small to characterize.

Musculoskeletal: No aggressive appearing focal osseous lesions. Mild
thoracic spondylosis.
IMPRESSION: 1. Two tiny solid pulmonary nodules in the lower lobes, largest 4
mm, for which follow-up chest CT is advised in 3 months.
2. No thoracic adenopathy or other potential findings of metastatic
disease in the chest.
3. Redemonstration of indeterminate small low-attenuation liver
lesions, for which MRI abdomen without and with IV contrast is
recommended for further characterization.
